# Patient Record
Sex: Female | Born: 1950 | Race: White | Hispanic: No | Marital: Married | Smoking: Never smoker
Health system: Southern US, Community
[De-identification: ages and names within clinical notes are randomized; demographics above are authoritative.]

## PROBLEM LIST (undated history)

## (undated) DIAGNOSIS — C801 Malignant (primary) neoplasm, unspecified: Secondary | ICD-10-CM

## (undated) DIAGNOSIS — F329 Major depressive disorder, single episode, unspecified: Secondary | ICD-10-CM

## (undated) DIAGNOSIS — F419 Anxiety disorder, unspecified: Secondary | ICD-10-CM

## (undated) DIAGNOSIS — M810 Age-related osteoporosis without current pathological fracture: Secondary | ICD-10-CM

## (undated) DIAGNOSIS — M2550 Pain in unspecified joint: Secondary | ICD-10-CM

## (undated) DIAGNOSIS — E079 Disorder of thyroid, unspecified: Secondary | ICD-10-CM

## (undated) DIAGNOSIS — E785 Hyperlipidemia, unspecified: Secondary | ICD-10-CM

## (undated) DIAGNOSIS — F32A Depression, unspecified: Secondary | ICD-10-CM

## (undated) DIAGNOSIS — T148XXA Other injury of unspecified body region, initial encounter: Secondary | ICD-10-CM

## (undated) DIAGNOSIS — H269 Unspecified cataract: Secondary | ICD-10-CM

## (undated) HISTORY — PX: FRACTURE SURGERY: SHX138

## (undated) HISTORY — PX: EYE SURGERY: SHX253

## (undated) HISTORY — DX: Disorder of thyroid, unspecified: E07.9

## (undated) HISTORY — DX: Anxiety disorder, unspecified: F41.9

## (undated) HISTORY — PX: TUBAL LIGATION: SHX77

## (undated) HISTORY — DX: Age-related osteoporosis without current pathological fracture: M81.0

## (undated) HISTORY — DX: Hyperlipidemia, unspecified: E78.5

## (undated) HISTORY — DX: Depression, unspecified: F32.A

## (undated) HISTORY — DX: Other injury of unspecified body region, initial encounter: T14.8XXA

## (undated) HISTORY — DX: Unspecified cataract: H26.9

## (undated) HISTORY — PX: SKIN CANCER EXCISION: SHX779

## (undated) HISTORY — DX: Major depressive disorder, single episode, unspecified: F32.9

## (undated) HISTORY — DX: Pain in unspecified joint: M25.50

---

## 2014-07-11 HISTORY — PX: COLONOSCOPY: SHX174

## 2014-12-15 ENCOUNTER — Encounter: Payer: Self-pay | Admitting: Family Medicine

## 2014-12-15 ENCOUNTER — Ambulatory Visit (INDEPENDENT_AMBULATORY_CARE_PROVIDER_SITE_OTHER): Payer: Medicare Other | Admitting: Family Medicine

## 2014-12-15 VITALS — BP 120/80 | HR 62 | Ht 62.0 in | Wt 165.0 lb

## 2014-12-15 DIAGNOSIS — Z860100 Personal history of colon polyps, unspecified: Secondary | ICD-10-CM

## 2014-12-15 DIAGNOSIS — F418 Other specified anxiety disorders: Secondary | ICD-10-CM | POA: Diagnosis not present

## 2014-12-15 DIAGNOSIS — M810 Age-related osteoporosis without current pathological fracture: Secondary | ICD-10-CM | POA: Diagnosis not present

## 2014-12-15 DIAGNOSIS — M25562 Pain in left knee: Secondary | ICD-10-CM | POA: Diagnosis not present

## 2014-12-15 DIAGNOSIS — Z820 Family history of epilepsy and other diseases of the nervous system: Secondary | ICD-10-CM | POA: Diagnosis not present

## 2014-12-15 DIAGNOSIS — Z79899 Other long term (current) drug therapy: Secondary | ICD-10-CM | POA: Diagnosis not present

## 2014-12-15 DIAGNOSIS — E559 Vitamin D deficiency, unspecified: Secondary | ICD-10-CM

## 2014-12-15 DIAGNOSIS — E66811 Obesity, class 1: Secondary | ICD-10-CM | POA: Insufficient documentation

## 2014-12-15 DIAGNOSIS — Z806 Family history of leukemia: Secondary | ICD-10-CM | POA: Diagnosis not present

## 2014-12-15 DIAGNOSIS — E785 Hyperlipidemia, unspecified: Secondary | ICD-10-CM | POA: Diagnosis not present

## 2014-12-15 DIAGNOSIS — F325 Major depressive disorder, single episode, in full remission: Secondary | ICD-10-CM | POA: Insufficient documentation

## 2014-12-15 DIAGNOSIS — E669 Obesity, unspecified: Secondary | ICD-10-CM | POA: Diagnosis not present

## 2014-12-15 DIAGNOSIS — E039 Hypothyroidism, unspecified: Secondary | ICD-10-CM | POA: Diagnosis not present

## 2014-12-15 DIAGNOSIS — Z23 Encounter for immunization: Secondary | ICD-10-CM

## 2014-12-15 DIAGNOSIS — Z801 Family history of malignant neoplasm of trachea, bronchus and lung: Secondary | ICD-10-CM

## 2014-12-15 MED ORDER — ZOSTER VACCINE LIVE 19400 UNT/0.65ML ~~LOC~~ SOLR: 0.6500 mL | Freq: Once | SUBCUTANEOUS | Status: DC

## 2014-12-15 MED ORDER — SERTRALINE HCL 50 MG PO TABS: 50.0000 mg | ORAL_TABLET | Freq: Every day | ORAL | Status: DC

## 2014-12-15 MED ORDER — NAPROXEN 500 MG PO TABS: 500.0000 mg | ORAL_TABLET | Freq: Two times a day (BID) | ORAL | Status: DC

## 2014-12-15 NOTE — Patient Instructions (Signed)
The following medications may be stopped:  Alendronate Atorvastatin B complex vitamin Potassium Ester-C Caltrate

## 2014-12-18 NOTE — Progress Notes (Signed)
Date:  12/15/2014   Name:  Debbie Ortega   DOB:  [birth year]   MRN:  [id number]  PCP:  Adline Potter, MD    Chief Complaint: Establish Care and Knee Pain   History of Present Illness:  This is a 64 y.o. female for initial visit, moved to [place] last month. C/o L knee pain since 08/28/2022, seen UC Sept, XR negative, given steroids and tramadol, saw PCP in Nov, had negative [place] and ortho referral/MRI advised, given another course steroids/tramadol. Currently taking Advil 600 mg bid only, still having pain, worse with walking and prolonged standing, swells on occasion, no hx trauma. Hx hypothyroidism, TSH 3.86 last month. Hx osteoporosis on Fosamax for several years. Hx depression/anxiety on Zoloft x 22yrs and prn Xanax, well controlled. Hx HLD on Lipitor past year. Hx vit D def on oral replacement. Three polyps on colonoscopy 2012, repeat in July negative, told needs repeat in 5 yrs. Due tetanus imm in 2017, had flu imm and Prevnar in Oct, never had zoster imm. Had neg mammo in April and saw optho in Aug 28, 2022. Husband died 4 yrs ago with COPD (smoker), mother died lung CA (smoker), father died in 8's with Parkinsonism and bowel obstruction, brother died leukemia.  Review of Systems:  Review of Systems  Constitutional: Negative for fever and chills.  HENT: Negative for ear pain, sore throat and trouble swallowing.   Eyes: Negative for pain.  Respiratory: Negative for shortness of breath.   Cardiovascular: Negative for chest pain.  Gastrointestinal: Negative for abdominal pain.  Endocrine: Negative for polyuria.  Genitourinary: Negative for difficulty urinating and pelvic pain.  Neurological: Negative for syncope and light-headedness.  Hematological: Negative for adenopathy.  Psychiatric/Behavioral: Negative for sleep disturbance.    Patient Active Problem List   Diagnosis Date Noted  . Left knee pain 12/15/2014  . Osteoporosis 12/15/2014  . Hypothyroidism 12/15/2014  . Depression with anxiety  12/15/2014  . Hyperlipidemia 12/15/2014  . Hx of colonic polyps 12/15/2014  . FH: lung cancer 12/15/2014  . FH: Parkinson's disease 12/15/2014  . FH: leukemia 12/15/2014  . Obesity, Class I, BMI 30-34.9 12/15/2014  . Vitamin D deficiency 12/15/2014    Prior to Admission medications   Medication Sig Start Date End Date Taking? Authorizing Provider  ALPRAZolam (XANAX) 0.25 MG tablet Take 0.25 mg by mouth every 6 (six) hours as needed.   Yes Historical Provider, MD  cholecalciferol (VITAMIN D) 400 UNITS TABS tablet Take 400 Units by mouth daily.   Yes Historical Provider, MD  levothyroxine (SYNTHROID, LEVOTHROID) 50 MCG tablet Take 50 mcg by mouth daily before breakfast. 1 day a week   Yes Historical Provider, MD  levothyroxine (SYNTHROID, LEVOTHROID) 75 MCG tablet Take 75 mcg by mouth daily before breakfast. 6 days a week    Yes Historical Provider, MD  Multiple Vitamin (MULTIVITAMIN WITH MINERALS) TABS tablet Take 1 tablet by mouth daily.   Yes Historical Provider, MD  naproxen (NAPROSYN) 500 MG tablet Take 1 tablet (500 mg total) by mouth 2 (two) times daily with a meal. 12/15/14   Adline Potter, MD  sertraline (ZOLOFT) 50 MG tablet Take 1 tablet (50 mg total) by mouth daily. 12/15/14  Yes Adline Potter, MD  zoster vaccine live, PF, (ZOSTAVAX) 19147 UNT/0.65ML injection Inject 19,400 Units into the skin once. 12/15/14   Adline Potter, MD    No Known Allergies  Past Surgical History  Procedure Laterality Date  . Tubal ligation    . Skin cancer excision  nose  . Colonoscopy  07/2014    normal cleared for 10 yrs    Social History  Substance Use Topics  . Smoking status: Never Smoker   . Smokeless tobacco: None  . Alcohol Use: 0.0 oz/week    0 Standard drinks or equivalent per week    History reviewed. No pertinent family history.  Medication list has been reviewed and updated.  Physical Examination: BP 120/80 mmHg  Pulse 62  Ht 5\' 2"  (1.575 m)  Wt 165 lb (74.844 kg)   BMI 30.17 kg/m2  Physical Exam  Constitutional: She is oriented to person, place, and time. She appears well-developed and well-nourished.  HENT:  Head: Normocephalic and atraumatic.  Right Ear: External ear normal.  Left Ear: External ear normal.  Nose: Nose normal.  Mouth/Throat: Oropharynx is clear and moist.  Eyes: Conjunctivae and EOM are normal. Pupils are equal, round, and reactive to light.  Neck: Neck supple. No thyromegaly present.  Cardiovascular: Normal rate, regular rhythm and normal heart sounds.   Pulmonary/Chest: Effort normal and breath sounds normal.  Abdominal: Soft. She exhibits no distension and no mass. There is no tenderness.  Musculoskeletal: She exhibits no edema.  L knee stable, no effusion, mild posterior tenderness  Lymphadenopathy:    She has no cervical adenopathy.  Neurological: She is alert and oriented to person, place, and time. Coordination normal.  Skin: Skin is warm and dry.  Psychiatric: She has a normal mood and affect. Her behavior is normal.  Nursing note and vitals reviewed.   Assessment and Plan:  1. Left knee pain Persistent x 4 months, change Advil to Naprosyn, refer ortho, likely needs MRI - naproxen (NAPROSYN) 500 MG tablet; Take 1 tablet (500 mg total) by mouth 2 (two) times daily with a meal.  Dispense: 30 tablet; Refill: 0 - Ambulatory referral to Orthopedic Surgery  2. Osteoporosis Discussed risk/benefits Fosamax, recommend d/c  3. Hypothyroidism, unspecified hypothyroidism type TSH normal last month. Consider change to 75 mcg daily after TSH next visit.  4. Depression with anxiety Well controlled, cont Zoloft, use Xanax sparingly (risks discussed)  5. Hyperlipidemia Lipid profile from last month reviewed, recommend d/c Lipitor, recheck lipids next visit  6. Obesity, Class I, BMI 30-34.9 Discussed exercise/weight loss  7. Vitamin D deficiency Continue supplementation, recheck level next visit  8. Polypharmacy D/c B  complex, potassium, ester-C, and Caltrate  9. Hx of colonic polyps Colonoscopy due 2021  10. FH: lung cancer  11. FH: Parkinson's disease  12. FH: leukemia  13. Need for zoster vaccination Rx for zoster imm sent, needs Pneumovax after April   Return in about 4 weeks (around 01/12/2015).  Satira Anis. [facility]  12/18/2014

## 2015-01-15 ENCOUNTER — Ambulatory Visit: Payer: Medicare Other | Admitting: Family Medicine

## 2015-03-26 ENCOUNTER — Telehealth: Payer: Self-pay

## 2015-03-26 NOTE — Telephone Encounter (Signed)
Sent to [facility] 

## 2015-03-27 MED ORDER — LEVOTHYROXINE SODIUM 75 MCG PO TABS: 75.0000 ug | ORAL_TABLET | Freq: Every day | ORAL | Status: DC

## 2015-03-27 NOTE — Addendum Note (Signed)
Addended by: Adline Potter on: 03/27/2015 04:28 PM   Modules accepted: Orders

## 2015-03-27 NOTE — Telephone Encounter (Signed)
Dose increased to 75 mcg daily (instead of 75 mcg 6 days/week and 50 mcg once a week). Generic should work fine. Recommend return to office in one month for re-eval and repeat TSH (was supposed to see me back in January).

## 2015-03-30 ENCOUNTER — Other Ambulatory Visit: Payer: Self-pay

## 2015-03-30 ENCOUNTER — Telehealth: Payer: Self-pay

## 2015-03-30 ENCOUNTER — Other Ambulatory Visit: Payer: Self-pay | Admitting: Family Medicine

## 2015-03-30 MED ORDER — LEVOTHYROXINE SODIUM 75 MCG PO TABS: 75.0000 ug | ORAL_TABLET | Freq: Every day | ORAL | Status: DC

## 2015-03-30 NOTE — Progress Notes (Signed)
Called pt with return to clinic in 1 month

## 2015-04-06 ENCOUNTER — Telehealth: Payer: Self-pay

## 2015-04-06 NOTE — Telephone Encounter (Signed)
Sent to [facility] 

## 2015-04-07 NOTE — Telephone Encounter (Signed)
Pt will try levothy. 82mcg- "If I start to feel funny, I want to go to someone else." I told her to sched an appt with you in a couple of weeks for follow up

## 2015-04-07 NOTE — Telephone Encounter (Signed)
Suspect insurance is not paying for brand name Synthroid as generic available. Generic is okay to use. Also, I have changed dose from 75 mcg six days a week and 50 mcg one day a week to 75 mcg every day.

## 2015-06-21 ENCOUNTER — Encounter: Payer: Self-pay | Admitting: Emergency Medicine

## 2015-06-21 ENCOUNTER — Emergency Department
Admission: EM | Admit: 2015-06-21 | Discharge: 2015-06-21 | Disposition: A | Discharge status: Home / Self Care | Payer: Medicare Other | Attending: Emergency Medicine | Admitting: Emergency Medicine

## 2015-06-21 ENCOUNTER — Emergency Department: Payer: Medicare Other

## 2015-06-21 DIAGNOSIS — Z791 Long term (current) use of non-steroidal anti-inflammatories (NSAID): Secondary | ICD-10-CM | POA: Diagnosis not present

## 2015-06-21 DIAGNOSIS — S82842A Displaced bimalleolar fracture of left lower leg, initial encounter for closed fracture: Secondary | ICD-10-CM

## 2015-06-21 DIAGNOSIS — M810 Age-related osteoporosis without current pathological fracture: Secondary | ICD-10-CM | POA: Diagnosis not present

## 2015-06-21 DIAGNOSIS — S8252XA Displaced fracture of medial malleolus of left tibia, initial encounter for closed fracture: Secondary | ICD-10-CM | POA: Diagnosis not present

## 2015-06-21 DIAGNOSIS — Y939 Activity, unspecified: Secondary | ICD-10-CM | POA: Insufficient documentation

## 2015-06-21 DIAGNOSIS — Y999 Unspecified external cause status: Secondary | ICD-10-CM | POA: Diagnosis not present

## 2015-06-21 DIAGNOSIS — Y929 Unspecified place or not applicable: Secondary | ICD-10-CM | POA: Diagnosis not present

## 2015-06-21 DIAGNOSIS — Z85828 Personal history of other malignant neoplasm of skin: Secondary | ICD-10-CM | POA: Diagnosis not present

## 2015-06-21 DIAGNOSIS — E785 Hyperlipidemia, unspecified: Secondary | ICD-10-CM | POA: Insufficient documentation

## 2015-06-21 DIAGNOSIS — F329 Major depressive disorder, single episode, unspecified: Secondary | ICD-10-CM | POA: Insufficient documentation

## 2015-06-21 DIAGNOSIS — E039 Hypothyroidism, unspecified: Secondary | ICD-10-CM | POA: Insufficient documentation

## 2015-06-21 DIAGNOSIS — Z79899 Other long term (current) drug therapy: Secondary | ICD-10-CM | POA: Diagnosis not present

## 2015-06-21 DIAGNOSIS — W010XXA Fall on same level from slipping, tripping and stumbling without subsequent striking against object, initial encounter: Secondary | ICD-10-CM | POA: Diagnosis not present

## 2015-06-21 DIAGNOSIS — S99912A Unspecified injury of left ankle, initial encounter: Secondary | ICD-10-CM | POA: Diagnosis present

## 2015-06-21 DIAGNOSIS — T148XXA Other injury of unspecified body region, initial encounter: Secondary | ICD-10-CM

## 2015-06-21 HISTORY — DX: Other injury of unspecified body region, initial encounter: T14.8XXA

## 2015-06-21 MED ORDER — BACITRACIN ZINC 500 UNIT/GM EX OINT
TOPICAL_OINTMENT | Freq: Once | CUTANEOUS | Status: AC
  Administered 2015-06-21: 1 via TOPICAL
  Filled 2015-06-21: qty 0.9

## 2015-06-21 MED ORDER — OXYCODONE-ACETAMINOPHEN 5-325 MG PO TABS: 1.0000 | ORAL_TABLET | ORAL | Status: DC | PRN

## 2015-06-21 MED ORDER — HYDROCODONE-ACETAMINOPHEN 5-325 MG PO TABS
2.0000 | ORAL_TABLET | Freq: Once | ORAL | Status: AC
  Administered 2015-06-21: 2 via ORAL
  Filled 2015-06-21: qty 2

## 2015-06-21 NOTE — ED Notes (Signed)
Pt comes into the ED via POV c/o left ankle pain after tripping over the dog and falling down some stairs.  Patient denies hitting her head or losing consciousness.  Ankle is noted to have some swelling and minimal movement in the left ankle.

## 2015-06-21 NOTE — Discharge Instructions (Signed)
Ankle Fracture A fracture is a break in a bone. The ankle joint is made up of three bones. These include the lower (distal)sections of your lower leg bones, called the tibia and fibula, along with a bone in your foot, called the talus. Depending on how bad the break is and if more than one ankle joint bone is broken, a cast or splint is used to protect and keep your injured bone from moving while it heals. Sometimes, surgery is required to help the fracture heal properly.  There are two general types of fractures:  Stable fracture. This includes a single fracture line through one bone, with no injury to ankle ligaments. A fracture of the talus that does not have any displacement (movement of the bone on either side of the fracture line) is also stable.  Unstable fracture. This includes more than one fracture line through one or more bones in the ankle joint. It also includes fractures that have displacement of the bone on either side of the fracture line. CAUSES  A direct blow to the ankle.   Quickly and severely twisting your ankle.  Trauma, such as a car accident or falling from a significant height. RISK FACTORS You may be at a higher risk of ankle fracture if:  You have certain medical conditions.  You are involved in high-impact sports.  You are involved in a high-impact car accident. SIGNS AND SYMPTOMS   Tender and swollen ankle.  Bruising around the injured ankle.  Pain on movement of the ankle.  Difficulty walking or putting weight on the ankle.  A cold foot below the site of the ankle injury. This can occur if the blood vessels passing through your injured ankle were also damaged.  Numbness in the foot below the site of the ankle injury. DIAGNOSIS  An ankle fracture is usually diagnosed with a physical exam and X-rays. A CT scan may also be required for complex fractures. TREATMENT  Stable fractures are treated with a cast or splint and using crutches to avoid putting  weight on your injured ankle. This is followed by an ankle strengthening program. Some patients require a special type of cast, depending on other medical problems they may have. Unstable fractures require surgery to ensure the bones heal properly. Your health care provider will tell you what type of fracture you have and the best treatment for your condition. HOME CARE INSTRUCTIONS   Review correct crutch use with your health care provider and use your crutches as directed. Safe use of crutches is extremely important. Misuse of crutches can cause you to fall or cause injury to nerves in your hands or armpits.  Do not put weight or pressure on the injured ankle until directed by your health care provider.  To lessen the swelling, keep the injured leg elevated while sitting or lying down.  Apply ice to the injured area:  Put ice in a plastic bag.  Place a towel between your cast and the bag.  Leave the ice on for 20 minutes, 2-3 times a day.  If you have a plaster or fiberglass cast:  Do not try to scratch the skin under the cast with any objects. This can increase your risk of skin infection.  Check the skin around the cast every day. You may put lotion on any red or sore areas.  Keep your cast dry and clean.  If you have a plaster splint:  Wear the splint as directed.  You may loosen the elastic  around the splint if your toes become numb, tingle, or turn cold or blue. °· Do not put pressure on any part of your cast or splint; it may break. Rest your cast only on a pillow the first 24 hours until it is fully hardened. °· Your cast or splint can be protected during bathing with a plastic bag sealed to your skin with medical tape. Do not lower the cast or splint into water. °· Take medicines as directed by your health care provider. Only take over-the-counter or prescription medicines for pain, discomfort, or fever as directed by your health care provider. °· Do not drive a vehicle until  your health care provider specifically tells you it is safe to do so. °· If your health care provider has given you a follow-up appointment, it is very important to keep that appointment. Not keeping the appointment could result in a chronic or permanent injury, pain, and disability. If you have any problem keeping the appointment, call the facility for assistance. °SEEK MEDICAL CARE IF: °You develop increased swelling or discomfort. °SEEK IMMEDIATE MEDICAL CARE IF:  °· Your cast gets damaged or breaks. °· You have continued severe pain. °· You develop new pain or swelling after the cast was put on. °· Your skin or toenails below the injury turn blue or gray. °· Your skin or toenails below the injury feel cold, numb, or have loss of sensitivity to touch. °· There is a bad smell or pus draining from under the cast. °MAKE SURE YOU:  °· Understand these instructions. °· Will watch your condition. °· Will get help right away if you are not doing well or get worse. °  °This information is not intended to replace advice given to you by your health care provider. Make sure you discuss any questions you have with your health care provider. °  °Document Released: 12/25/1999 Document Revised: 01/01/2013 Document Reviewed: 07/26/2012 °Elsevier Interactive Patient Education ©2016 [employer] ° °Cast or Splint Care °Casts and splints support injured limbs and keep bones from moving while they heal. It is important to care for your cast or splint at home.   °HOME CARE INSTRUCTIONS °· Keep the cast or splint uncovered during the drying period. It can take 24 to 48 hours to dry if it is made of plaster. A fiberglass cast will dry in less than 1 hour. °· Do not rest the cast on anything harder than a pillow for the first 24 hours. °· Do not put weight on your injured limb or apply pressure to the cast until your health care provider gives you permission. °· Keep the cast or splint dry. Wet casts or splints can lose their shape and  may not support the limb as well. A wet cast that has lost its shape can also create harmful pressure on your skin when it dries. Also, wet skin can become infected. °¨ Cover the cast or splint with a plastic bag when bathing or when out in the rain or snow. If the cast is on the trunk of the body, take sponge baths until the cast is removed. °¨ If your cast does become wet, dry it with a towel or a blow dryer on the cool setting only. °· Keep your cast or splint clean. Soiled casts may be wiped with a moistened cloth. °· Do not place any hard or soft foreign objects under your cast or splint, such as cotton, toilet paper, lotion, or powder. °· Do not try to scratch the   skin under the cast with any object. The object could get stuck inside the cast. Also, scratching could lead to an infection. If itching is a problem, use a blow dryer on a cool setting to relieve discomfort. °· Do not trim or cut your cast or remove padding from inside of it. °· Exercise all joints next to the injury that are not immobilized by the cast or splint. For example, if you have a long leg cast, exercise the hip joint and toes. If you have an arm cast or splint, exercise the shoulder, elbow, thumb, and fingers. °· Elevate your injured arm or leg on 1 or 2 pillows for the first 1 to 3 days to decrease swelling and pain. It is best if you can comfortably elevate your cast so it is higher than your heart. °SEEK MEDICAL CARE IF:  °· Your cast or splint cracks. °· Your cast or splint is too tight or too loose. °· You have unbearable itching inside the cast. °· Your cast becomes wet or develops a soft spot or area. °· You have a bad smell coming from inside your cast. °· You get an object stuck under your cast. °· Your skin around the cast becomes red or raw. °· You have new pain or worsening pain after the cast has been applied. °SEEK IMMEDIATE MEDICAL CARE IF:  °· You have fluid leaking through the cast. °· You are unable to move your fingers  or toes. °· You have discolored (blue or white), cool, painful, or very swollen fingers or toes beyond the cast. °· You have tingling or numbness around the injured area. °· You have severe pain or pressure under the cast. °· You have any difficulty with your breathing or have shortness of breath. °· You have chest pain. °  °This information is not intended to replace advice given to you by your health care provider. Make sure you discuss any questions you have with your health care provider. °  °Document Released: 12/25/1999 Document Revised: 10/17/2012 Document Reviewed: 07/05/2012 °Elsevier Interactive Patient Education ©2016 [employer] ° °
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
## 2015-06-23 DIAGNOSIS — S82842A Displaced bimalleolar fracture of left lower leg, initial encounter for closed fracture: Secondary | ICD-10-CM | POA: Diagnosis not present

## 2015-06-24 ENCOUNTER — Ambulatory Visit: Payer: Medicare Other | Admitting: Family Medicine

## 2015-06-25 ENCOUNTER — Ambulatory Visit
Admission: RE | Admit: 2015-06-25 | Discharge: 2015-06-26 | Disposition: A | Discharge status: Home / Self Care | Payer: Medicare Other | Source: Ambulatory Visit | Attending: Orthopedic Surgery | Admitting: Orthopedic Surgery

## 2015-06-25 ENCOUNTER — Ambulatory Visit: Payer: Medicare Other | Admitting: Anesthesiology

## 2015-06-25 ENCOUNTER — Ambulatory Visit: Payer: Medicare Other

## 2015-06-25 ENCOUNTER — Encounter
Admission: RE | Disposition: A | Discharge status: Home / Self Care | Payer: Self-pay | Source: Ambulatory Visit | Attending: Orthopedic Surgery

## 2015-06-25 DIAGNOSIS — Z8781 Personal history of (healed) traumatic fracture: Secondary | ICD-10-CM

## 2015-06-25 DIAGNOSIS — M810 Age-related osteoporosis without current pathological fracture: Secondary | ICD-10-CM | POA: Insufficient documentation

## 2015-06-25 DIAGNOSIS — E785 Hyperlipidemia, unspecified: Secondary | ICD-10-CM | POA: Diagnosis not present

## 2015-06-25 DIAGNOSIS — Z79899 Other long term (current) drug therapy: Secondary | ICD-10-CM | POA: Diagnosis not present

## 2015-06-25 DIAGNOSIS — Z86010 Personal history of colonic polyps: Secondary | ICD-10-CM | POA: Diagnosis not present

## 2015-06-25 DIAGNOSIS — Z9889 Other specified postprocedural states: Secondary | ICD-10-CM

## 2015-06-25 DIAGNOSIS — E039 Hypothyroidism, unspecified: Secondary | ICD-10-CM | POA: Diagnosis not present

## 2015-06-25 DIAGNOSIS — S82842A Displaced bimalleolar fracture of left lower leg, initial encounter for closed fracture: Secondary | ICD-10-CM | POA: Insufficient documentation

## 2015-06-25 DIAGNOSIS — F418 Other specified anxiety disorders: Secondary | ICD-10-CM | POA: Diagnosis not present

## 2015-06-25 DIAGNOSIS — Z9851 Tubal ligation status: Secondary | ICD-10-CM | POA: Insufficient documentation

## 2015-06-25 DIAGNOSIS — S8262XA Displaced fracture of lateral malleolus of left fibula, initial encounter for closed fracture: Secondary | ICD-10-CM | POA: Diagnosis not present

## 2015-06-25 DIAGNOSIS — W109XXA Fall (on) (from) unspecified stairs and steps, initial encounter: Secondary | ICD-10-CM | POA: Insufficient documentation

## 2015-06-25 HISTORY — PX: ORIF ANKLE FRACTURE: SHX5408

## 2015-06-25 SURGERY — OPEN REDUCTION INTERNAL FIXATION (ORIF) ANKLE FRACTURE
Anesthesia: General | Site: Ankle | Laterality: Left | Wound class: Clean

## 2015-06-25 MED ORDER — ONDANSETRON HCL 4 MG/2ML IJ SOLN: 4.0000 mg | Freq: Four times a day (QID) | INTRAMUSCULAR | Status: DC | PRN

## 2015-06-25 MED ORDER — LACTATED RINGERS IV SOLN
INTRAVENOUS | Status: DC | PRN
  Administered 2015-06-25: 12:00:00 via INTRAVENOUS

## 2015-06-25 MED ORDER — PROMETHAZINE HCL 25 MG/ML IJ SOLN
25.0000 mg | Freq: Once | INTRAMUSCULAR | Status: AC
  Administered 2015-06-25: 25 mg via INTRAMUSCULAR

## 2015-06-25 MED ORDER — OXYCODONE HCL 5 MG PO TABS
10.0000 mg | ORAL_TABLET | Freq: Once | ORAL | Status: AC
  Administered 2015-06-25: 10 mg via ORAL
  Filled 2015-06-25: qty 2

## 2015-06-25 MED ORDER — PROPOFOL 10 MG/ML IV BOLUS
INTRAVENOUS | Status: DC | PRN
  Administered 2015-06-25: 160 mg via INTRAVENOUS

## 2015-06-25 MED ORDER — ADULT MULTIVITAMIN W/MINERALS CH
1.0000 | ORAL_TABLET | Freq: Every day | ORAL | Status: DC
  Administered 2015-06-26: 1 via ORAL
  Filled 2015-06-25: qty 1

## 2015-06-25 MED ORDER — OXYCODONE HCL 5 MG PO TABS
5.0000 mg | ORAL_TABLET | ORAL | Status: DC | PRN
  Administered 2015-06-25: 5 mg via ORAL
  Administered 2015-06-25 – 2015-06-26 (×4): 10 mg via ORAL
  Filled 2015-06-25 (×4): qty 2

## 2015-06-25 MED ORDER — FENTANYL CITRATE (PF) 100 MCG/2ML IJ SOLN
25.0000 ug | INTRAMUSCULAR | Status: AC | PRN
  Administered 2015-06-25 (×6): 25 ug via INTRAVENOUS

## 2015-06-25 MED ORDER — OXYCODONE HCL 5 MG PO TABS
ORAL_TABLET | ORAL | Status: AC
  Filled 2015-06-25: qty 1

## 2015-06-25 MED ORDER — HYDROMORPHONE HCL 1 MG/ML IJ SOLN
0.2500 mg | INTRAMUSCULAR | Status: DC | PRN
  Administered 2015-06-25 (×4): 0.5 mg via INTRAVENOUS

## 2015-06-25 MED ORDER — CHOLECALCIFEROL 10 MCG (400 UNIT) PO TABS
400.0000 [IU] | ORAL_TABLET | Freq: Every day | ORAL | Status: DC
  Administered 2015-06-26: 400 [IU] via ORAL
  Filled 2015-06-25 (×2): qty 1

## 2015-06-25 MED ORDER — FENTANYL CITRATE (PF) 100 MCG/2ML IJ SOLN
INTRAMUSCULAR | Status: AC
  Filled 2015-06-25: qty 2

## 2015-06-25 MED ORDER — ALPRAZOLAM 0.25 MG PO TABS: 0.2500 mg | ORAL_TABLET | Freq: Four times a day (QID) | ORAL | Status: DC | PRN

## 2015-06-25 MED ORDER — EPHEDRINE SULFATE 50 MG/ML IJ SOLN
INTRAMUSCULAR | Status: DC | PRN
  Administered 2015-06-25: 10 mg via INTRAVENOUS

## 2015-06-25 MED ORDER — BISACODYL 5 MG PO TBEC: 5.0000 mg | DELAYED_RELEASE_TABLET | Freq: Every day | ORAL | Status: DC | PRN

## 2015-06-25 MED ORDER — ONDANSETRON HCL 4 MG/2ML IJ SOLN
4.0000 mg | Freq: Once | INTRAMUSCULAR | Status: AC | PRN
  Administered 2015-06-25: 4 mg via INTRAVENOUS

## 2015-06-25 MED ORDER — SODIUM CHLORIDE 0.9 % IV SOLN
INTRAVENOUS | Status: DC
  Administered 2015-06-25 – 2015-06-26 (×2): via INTRAVENOUS

## 2015-06-25 MED ORDER — NEOMYCIN-POLYMYXIN B GU 40-200000 IR SOLN
Status: AC
Start: 2015-06-25 — End: 2015-06-25
  Filled 2015-06-25: qty 4

## 2015-06-25 MED ORDER — ONDANSETRON HCL 4 MG/2ML IJ SOLN
INTRAMUSCULAR | Status: AC
  Filled 2015-06-25: qty 2

## 2015-06-25 MED ORDER — CEFAZOLIN SODIUM-DEXTROSE 2-4 GM/100ML-% IV SOLN
INTRAVENOUS | Status: AC
  Filled 2015-06-25: qty 100

## 2015-06-25 MED ORDER — LEVOTHYROXINE SODIUM 75 MCG PO TABS
75.0000 ug | ORAL_TABLET | Freq: Every day | ORAL | Status: DC
  Administered 2015-06-26: 75 ug via ORAL
  Filled 2015-06-25: qty 1

## 2015-06-25 MED ORDER — METOCLOPRAMIDE HCL 10 MG PO TABS
5.0000 mg | ORAL_TABLET | Freq: Three times a day (TID) | ORAL | Status: DC | PRN
  Administered 2015-06-25: 10 mg via ORAL

## 2015-06-25 MED ORDER — ACETAMINOPHEN 650 MG RE SUPP: 650.0000 mg | Freq: Four times a day (QID) | RECTAL | Status: DC | PRN

## 2015-06-25 MED ORDER — MAGNESIUM HYDROXIDE 400 MG/5ML PO SUSP: 30.0000 mL | Freq: Every day | ORAL | Status: DC | PRN

## 2015-06-25 MED ORDER — SERTRALINE HCL 50 MG PO TABS
50.0000 mg | ORAL_TABLET | Freq: Every day | ORAL | Status: DC
  Administered 2015-06-25 – 2015-06-26 (×2): 50 mg via ORAL
  Filled 2015-06-25 (×2): qty 1

## 2015-06-25 MED ORDER — ACETAMINOPHEN 325 MG PO TABS: 650.0000 mg | ORAL_TABLET | Freq: Four times a day (QID) | ORAL | Status: DC | PRN

## 2015-06-25 MED ORDER — ONDANSETRON HCL 4 MG PO TABS
4.0000 mg | ORAL_TABLET | Freq: Four times a day (QID) | ORAL | Status: DC | PRN
  Administered 2015-06-25: 4 mg via ORAL

## 2015-06-25 MED ORDER — FAMOTIDINE 20 MG PO TABS
20.0000 mg | ORAL_TABLET | Freq: Once | ORAL | Status: AC
  Administered 2015-06-25: 20 mg via ORAL

## 2015-06-25 MED ORDER — ONDANSETRON HCL 4 MG/2ML IJ SOLN
INTRAMUSCULAR | Status: DC | PRN
  Administered 2015-06-25: 4 mg via INTRAVENOUS

## 2015-06-25 MED ORDER — CEFAZOLIN SODIUM 1-5 GM-% IV SOLN
1.0000 g | Freq: Four times a day (QID) | INTRAVENOUS | Status: AC
  Administered 2015-06-25 – 2015-06-26 (×3): 1 g via INTRAVENOUS
  Filled 2015-06-25 (×3): qty 50

## 2015-06-25 MED ORDER — HYDROMORPHONE HCL 1 MG/ML IJ SOLN
INTRAMUSCULAR | Status: AC
  Filled 2015-06-25: qty 2

## 2015-06-25 MED ORDER — OXYCODONE HCL 5 MG PO TABS: 5.0000 mg | ORAL_TABLET | ORAL | Status: DC | PRN

## 2015-06-25 MED ORDER — SODIUM CHLORIDE 0.9 % IV SOLN: INTRAVENOUS | Status: DC

## 2015-06-25 MED ORDER — DOCUSATE SODIUM 100 MG PO CAPS
100.0000 mg | ORAL_CAPSULE | Freq: Two times a day (BID) | ORAL | Status: DC
  Administered 2015-06-25 – 2015-06-26 (×2): 100 mg via ORAL
  Filled 2015-06-25 (×2): qty 1

## 2015-06-25 MED ORDER — PROMETHAZINE HCL 25 MG/ML IJ SOLN
INTRAMUSCULAR | Status: AC
  Filled 2015-06-25: qty 1

## 2015-06-25 MED ORDER — MORPHINE SULFATE (PF) 2 MG/ML IV SOLN
2.0000 mg | INTRAVENOUS | Status: DC | PRN
  Administered 2015-06-25 – 2015-06-26 (×2): 2 mg via INTRAVENOUS
  Filled 2015-06-25 (×2): qty 1

## 2015-06-25 MED ORDER — LIDOCAINE HCL (PF) 1 % IJ SOLN
INTRAMUSCULAR | Status: AC
  Filled 2015-06-25: qty 2

## 2015-06-25 MED ORDER — METOCLOPRAMIDE HCL 5 MG/ML IJ SOLN
INTRAMUSCULAR | Status: AC
  Filled 2015-06-25: qty 2

## 2015-06-25 MED ORDER — METOCLOPRAMIDE HCL 5 MG/ML IJ SOLN: 5.0000 mg | Freq: Three times a day (TID) | INTRAMUSCULAR | Status: DC | PRN

## 2015-06-25 MED ORDER — LACTATED RINGERS IV SOLN: INTRAVENOUS | Status: DC

## 2015-06-25 MED ORDER — FENTANYL CITRATE (PF) 100 MCG/2ML IJ SOLN
INTRAMUSCULAR | Status: DC | PRN
  Administered 2015-06-25 (×4): 25 ug via INTRAVENOUS

## 2015-06-25 MED ORDER — MIDAZOLAM HCL 2 MG/2ML IJ SOLN
INTRAMUSCULAR | Status: DC | PRN
  Administered 2015-06-25: 2 mg via INTRAVENOUS

## 2015-06-25 MED ORDER — FAMOTIDINE 20 MG PO TABS
ORAL_TABLET | ORAL | Status: AC
  Administered 2015-06-25: 20 mg via ORAL
  Filled 2015-06-25: qty 1

## 2015-06-25 MED ORDER — CEFAZOLIN SODIUM-DEXTROSE 2-4 GM/100ML-% IV SOLN
2.0000 g | Freq: Once | INTRAVENOUS | Status: AC
  Administered 2015-06-25: 2 g via INTRAVENOUS

## 2015-06-25 SURGICAL SUPPLY — 58 items
BANDAGE ACE 4X5 VEL STRL LF (GAUZE/BANDAGES/DRESSINGS) ×4 IMPLANT
BIT DRILL 2.5X2.75 QC CALB (BIT) ×2 IMPLANT
BIT DRILL CALIBRATED 2.7 (BIT) ×2 IMPLANT
BLADE SURG SZ10 CARB STEEL (BLADE) ×4 IMPLANT
BNDG ESMARK 4X12 TAN STRL LF (GAUZE/BANDAGES/DRESSINGS) ×2 IMPLANT
CANISTER SUCT 1200ML W/VALVE (MISCELLANEOUS) ×2 IMPLANT
CHLORAPREP W/TINT 26ML (MISCELLANEOUS) ×2 IMPLANT
CUFF TOURN 24 STER (MISCELLANEOUS) IMPLANT
CUFF TOURN 30 STER DUAL PORT (MISCELLANEOUS) ×2 IMPLANT
DRAPE FLUOR MINI C-ARM 54X84 (DRAPES) ×2 IMPLANT
DRAPE INCISE IOBAN 66X45 STRL (DRAPES) ×2 IMPLANT
DRAPE U-SHAPE 47X51 STRL (DRAPES) ×2 IMPLANT
DRSG EMULSION OIL 3X8 NADH (GAUZE/BANDAGES/DRESSINGS) ×2 IMPLANT
ELECT CAUTERY BLADE 6.4 (BLADE) ×2 IMPLANT
ELECT REM PT RETURN 9FT ADLT (ELECTROSURGICAL) ×2
ELECTRODE REM PT RTRN 9FT ADLT (ELECTROSURGICAL) ×1 IMPLANT
GAUZE PETRO XEROFOAM 1X8 (MISCELLANEOUS) ×2 IMPLANT
GAUZE SPONGE 4X4 12PLY STRL (GAUZE/BANDAGES/DRESSINGS) ×2 IMPLANT
GLOVE BIOGEL PI IND STRL 9 (GLOVE) ×1 IMPLANT
GLOVE BIOGEL PI INDICATOR 9 (GLOVE) ×1
GLOVE INDICATOR 7.5 STRL GRN (GLOVE) ×2 IMPLANT
GLOVE SURG ORTHO 9.0 STRL STRW (GLOVE) ×2 IMPLANT
GOWN STRL REUS W/ TWL LRG LVL3 (GOWN DISPOSABLE) ×1 IMPLANT
GOWN STRL REUS W/TWL LRG LVL3 (GOWN DISPOSABLE) ×1
GOWN SURG XXL (GOWNS) ×2 IMPLANT
HEMOVAC 400ML (MISCELLANEOUS) ×2
K-WIRE ACE 1.6X6 (WIRE) ×2
KIT DRAIN HEMOVAC JP 7FR 400ML (MISCELLANEOUS) ×1 IMPLANT
KIT RM TURNOVER STRD PROC AR (KITS) ×2 IMPLANT
KWIRE ACE 1.6X6 (WIRE) ×1 IMPLANT
LABEL OR SOLS (LABEL) ×2 IMPLANT
NS IRRIG 1000ML POUR BTL (IV SOLUTION) ×2 IMPLANT
PACK EXTREMITY ARMC (MISCELLANEOUS) ×2 IMPLANT
PAD ABD DERMACEA PRESS 5X9 (GAUZE/BANDAGES/DRESSINGS) ×4 IMPLANT
PAD CAST CTTN 4X4 STRL (SOFTGOODS) ×2 IMPLANT
PAD PREP 24X41 OB/GYN DISP (PERSONAL CARE ITEMS) ×2 IMPLANT
PADDING CAST COTTON 4X4 STRL (SOFTGOODS) ×2
PLATE LOCK 6H 77 BILAT FIB (Plate) ×2 IMPLANT
SCREW ACE CAN 4.0 36M (Screw) ×2 IMPLANT
SCREW LOCK CORT STAR 3.5X10 (Screw) ×2 IMPLANT
SCREW LOCK CORT STAR 3.5X12 (Screw) ×2 IMPLANT
SCREW LOCK CORT STAR 3.5X14 (Screw) ×2 IMPLANT
SCREW LOW PROFILE 12MMX3.5MM (Screw) ×2 IMPLANT
SCREW LOW PROFILE 18MMX3.5MM (Screw) ×2 IMPLANT
SCREW NON LOCKING LP 3.5 14MM (Screw) ×2 IMPLANT
SPLINT CAST 1 STEP 3X12 (MISCELLANEOUS) ×4 IMPLANT
SPONGE LAP 18X18 5 PK (GAUZE/BANDAGES/DRESSINGS) ×2 IMPLANT
STAPLER SKIN PROX 35W (STAPLE) ×2 IMPLANT
STOCKINETTE STRL 6IN 960660 (GAUZE/BANDAGES/DRESSINGS) ×2 IMPLANT
SUT ETHILON 3-0 FS-10 30 BLK (SUTURE) ×2
SUT MNCRL AB 4-0 PS2 18 (SUTURE) IMPLANT
SUT VIC AB 0 CT1 36 (SUTURE) IMPLANT
SUT VIC AB 2-0 SH 27 (SUTURE) ×1
SUT VIC AB 2-0 SH 27XBRD (SUTURE) ×1 IMPLANT
SUT VIC AB 3-0 SH 27 (SUTURE) ×1
SUT VIC AB 3-0 SH 27X BRD (SUTURE) ×1 IMPLANT
SUTURE EHLN 3-0 FS-10 30 BLK (SUTURE) ×1 IMPLANT
SYRINGE 10CC LL (SYRINGE) ×2 IMPLANT

## 2015-06-25 NOTE — Anesthesia Preprocedure Evaluation (Addendum)
Anesthesia Evaluation  Patient identified by MRN, date of birth, ID band Patient awake    Reviewed: Allergy & Precautions, NPO status , Patient's Chart, lab work & pertinent test results  History of Anesthesia Complications Negative for: history of anesthetic complications  Airway Mallampati: II       Dental   Pulmonary neg pulmonary ROS,           Cardiovascular negative cardio ROS       Neuro/Psych Anxiety Depression negative neurological ROS     GI/Hepatic negative GI ROS, Neg liver ROS,   Endo/Other  Hypothyroidism   Renal/GU negative Renal ROS     Musculoskeletal   Abdominal   Peds  Hematology negative hematology ROS (+)   Anesthesia Other Findings   Reproductive/Obstetrics                             Anesthesia Physical Anesthesia Plan  ASA: II  Anesthesia Plan: General   Post-op Pain Management:    Induction: Intravenous  Airway Management Planned: LMA  Additional Equipment:   Intra-op Plan:   Post-operative Plan:   Informed Consent: I have reviewed the patients History and Physical, chart, labs and discussed the procedure including the risks, benefits and alternatives for the proposed anesthesia with the patient or authorized representative who has indicated his/her understanding and acceptance.     Plan Discussed with:   Anesthesia Plan Comments:         Anesthesia Quick Evaluation

## 2015-06-25 NOTE — OR Nursing (Signed)
Pt wants to be admitted overnight. Dr Rudene Christians entered observation orders.

## 2015-06-25 NOTE — Discharge Instructions (Addendum)
Keep ankle elevated as much as possible through the weekend. Work on toe motion. Ice to front of ankle today and tomorrow. Aspirin 325 mg daily   AMBULATORY SURGERY  DISCHARGE INSTRUCTIONS   1) The drugs that you were given will stay in your system until tomorrow so for the next 24 hours you should not:  A) Drive an automobile B) Make any legal decisions C) Drink any alcoholic beverage   2) You may resume regular meals tomorrow.  Today it is better to start with liquids and gradually work up to solid foods.  You may eat anything you prefer, but it is better to start with liquids, then soup and crackers, and gradually work up to solid foods.   3) Please notify your doctor immediately if you have any unusual bleeding, trouble breathing, redness and pain at the surgery site, drainage, fever, or pain not relieved by medication.    4) Additional Instructions:        Please contact your physician with any problems or Same Day Surgery at [phone], Monday through Friday 6 am to 4 pm, or  at [facility] number at [phone].      Diet: As you were doing prior to hospitalization   Shower:  May shower but keep the wounds dry, use an occlusive plastic wrap, NO SOAKING IN TUB.  If the bandage gets wet, change with a clean dry gauze.  Dressing:  Keep splint clean and dry.   Activity:  Increase activity slowly as tolerated, but follow the weight bearing instructions below.  No lifting or driving for 6 weeks.  Weight Bearing:   Nonweightbearing left lower extremity.  To prevent constipation: you may use a stool softener such as -  Colace (over the counter) 100 mg by mouth twice a day  Drink plenty of fluids (prune juice may be helpful) and high fiber foods Miralax (over the counter) for constipation as needed.    Itching:  If you experience itching with your medications, try taking only a single pain pill, or even half a pain pill at a time.  You may take up to  10 pain pills per day, and you can also use benadryl over the counter for itching or also to help with sleep.   Precautions:  If you experience chest pain or shortness of breath - call 911 immediately for transfer to the hospital emergency department!!  If you develop a fever greater that 101 F, purulent drainage from wound, increased redness or drainage from wound, or calf pain-Call [place]                                               Follow- Up Appointment:  Please call for an appointment to be seen in 06/29/2015 at [facility]

## 2015-06-25 NOTE — OR Nursing (Signed)
Floor called for 15 minute call prior to taking pt to floor.

## 2015-06-25 NOTE — Op Note (Signed)
06/25/2015  1:11 PM  PATIENT:  Debbie Ortega  65 y.o. female  PRE-OPERATIVE DIAGNOSIS:  BIMALLEOLAR ANKLE FRACTURE CLOSED  POST-OPERATIVE DIAGNOSIS:  BIMALLEOLAR ANKLE FRACTURE CLOSED  PROCEDURE:  Procedure(s): OPEN REDUCTION INTERNAL FIXATION (ORIF) ANKLE FRACTURE (Left)  SURGEON: Laurene Footman, MD  ASSISTANTS: None  ANESTHESIA:   general  EBL:     BLOOD ADMINISTERED:none  DRAINS: none   LOCAL MEDICATIONS USED:  NONE  SPECIMEN:  No Specimen  DISPOSITION OF SPECIMEN:  N/A  COUNTS:  YES  TOURNIQUET:   48 minutes at 300 mmHg  IMPLANTS: Composite locking fibular plate with screws one 4.0 cannulated screw medial  DICTATION: .Dragon Dictation patient brought the operating room and after adequate anesthesia was obtained the left leg and draped in sterile fashion was turned by the upper thigh bump was placed on each buttock to internally rotate the leg. After patient identification and timeout procedures were completed tourniquet was raised to 3 mmHg. A lateral approach was made with a preservation of cutaneous nerves as much as possible. The fracture was held in a reduced position with reduction clamp and a 6 hole composite locking plate was contoured to fit the distal fibula and applied posterior laterally. Nonlocking screws placed proximally and distally get the plate down to the bone and then the remaining screw holes were filled drilling measuring and placing the screws proximally nonlocking distally locking. Anatomic reduction of the fibula fracture was obtained with this maneuver. Going medially and anterior medial approach was made and the fracture site exposed and the joint irrigated with no loose articular fragments noted. With the ankle dorsiflexed the medial malleolar fragment reduced well and a single 4.0 cannulated screws placed from the tip of the malleolus into the distal tibia 36 mm in length getting good compression at the fracture site. Under fluoroscopic views the  ankle appeared well aligned. The wound was were irrigated and then closed with 2-0 Vicryl subcutaneously and skin staples. Xeroform 4 x 4 web roll and Ace wrap applied with 2 3 and splints applied as a stirrup under the Ace wrap  PLAN OF CARE: Discharge to home after PACU  PATIENT DISPOSITION:  PACU - hemodynamically stable.

## 2015-06-25 NOTE — Anesthesia Procedure Notes (Signed)
Procedure Name: LMA Insertion Date/Time: 06/25/2015 11:57 AM Performed by: [id number], Briel Gallicchio Pre-anesthesia Checklist: Timeout performed, Patient being monitored, Suction available, Emergency Drugs available and Patient identified Patient Re-evaluated:Patient Re-evaluated prior to inductionOxygen Delivery Method: Circle system utilized Preoxygenation: Pre-oxygenation with 100% oxygen Intubation Type: IV induction LMA: LMA inserted LMA Size: 4.0 Tube type: Oral Number of attempts: 1 Placement Confirmation: breath sounds checked- equal and bilateral,  positive ETCO2 and CO2 detector Secured at: 0 cm Tube secured with: Tape

## 2015-06-25 NOTE — Anesthesia Postprocedure Evaluation (Signed)
Anesthesia Post Note  Patient: Debbie Ortega  Procedure(s) Performed: Procedure(s) (LRB): OPEN REDUCTION INTERNAL FIXATION (ORIF) ANKLE FRACTURE (Left)  Patient location during evaluation: PACU Anesthesia Type: General Level of consciousness: awake and alert Pain management: pain level controlled Vital Signs Assessment: post-procedure vital signs reviewed and stable Respiratory status: spontaneous breathing and respiratory function stable Cardiovascular status: stable Anesthetic complications: no    Last Vitals:  Filed Vitals:   06/25/15 1118  BP: 147/63  Pulse: 81  Temp: 37.2 C  Resp: 16    Last Pain:  Filed Vitals:   06/25/15 1120  PainSc: 1                  Blayde Bacigalupi K

## 2015-06-25 NOTE — Transfer of Care (Signed)
Immediate Anesthesia Transfer of Care Note  Patient: Debbie Ortega  Procedure(s) Performed: Procedure(s): OPEN REDUCTION INTERNAL FIXATION (ORIF) ANKLE FRACTURE (Left)  Patient Location: PACU  Anesthesia Type:General  Level of Consciousness: awake, oriented and patient cooperative  Airway & Oxygen Therapy: Patient Spontanous Breathing and Patient connected to face mask oxygen  Post-op Assessment: Report given to RN and Post -op Vital signs reviewed and stable  Post vital signs: Reviewed and stable  Last Vitals:  Filed Vitals:   06/25/15 1118  BP: 147/63  Pulse: 81  Temp: 37.2 C  Resp: 16    Last Pain:  Filed Vitals:   06/25/15 1120  PainSc: 1          Complications: No apparent anesthesia complications

## 2015-06-25 NOTE — Progress Notes (Signed)
Pt is nauseated.  Not due for nausea bed at this time.  Dr Rudene Christians notified.  A once of phenergan ordered

## 2015-06-25 NOTE — H&P (Signed)
Reviewed paper H+P, will be scanned into chart. No changes noted.  

## 2015-06-26 ENCOUNTER — Encounter: Payer: Self-pay | Admitting: Orthopedic Surgery

## 2015-06-26 DIAGNOSIS — S82842A Displaced bimalleolar fracture of left lower leg, initial encounter for closed fracture: Secondary | ICD-10-CM | POA: Diagnosis not present

## 2015-06-26 DIAGNOSIS — Z472 Encounter for removal of internal fixation device: Secondary | ICD-10-CM | POA: Diagnosis not present

## 2015-06-26 MED ORDER — ASPIRIN EC 325 MG PO TBEC: 325.0000 mg | DELAYED_RELEASE_TABLET | Freq: Every day | ORAL | Status: DC

## 2015-06-26 MED ORDER — PROMETHAZINE HCL 12.5 MG PO TABS: 12.5000 mg | ORAL_TABLET | Freq: Four times a day (QID) | ORAL | Status: DC | PRN

## 2015-06-26 MED ORDER — OXYCODONE HCL 5 MG PO TABS: 5.0000 mg | ORAL_TABLET | ORAL | Status: DC | PRN

## 2015-06-26 NOTE — Progress Notes (Signed)
Patient being discharged to home. IV removed, discharge and medication instructions given. Patient acknowledged understanding. Sister here to take her home.

## 2015-06-26 NOTE — Evaluation (Signed)
Physical Therapy Evaluation Patient Details Name: Debbie Ortega MRN: [id number] DOB: 06-Apr-1950 Today's Date: 06/26/2015   History of Present Illness  65 y/o female here post ankle fx and ORIF.  She had been trying to get around with crutches the last day or 2 and has fallen multiple times.  Clinical Impression  Pt was able to ambulate well with walker and overall feels much safer and more stable than with crutches.  She was able to maintain NWBing w/o issues and ultimately should be safe to go home with assist from family. Pt will likely need PT after ankle is healed and she is allowed to bear weight.      Follow Up Recommendations No PT follow up (will need PT once healed and allowed to weight-bear)    Equipment Recommendations  Rolling walker with 5" wheels    Recommendations for Other Services       Precautions / Restrictions Precautions Precautions: Fall Restrictions Weight Bearing Restrictions: Yes LLE Weight Bearing: Non weight bearing      Mobility  Bed Mobility Overal bed mobility: Independent                Transfers Overall transfer level: Modified independent Equipment used: Rolling walker (2 wheeled)             General transfer comment: Pt needing cues to use walker/UEs appropriate during transition, but overall is able to maintain NWBing w/o issue.  Ambulation/Gait Ambulation/Gait assistance: Min guard Ambulation Distance (Feet): 50 Feet Assistive device: Rolling walker (2 wheeled)       General Gait Details: Pt is able to keep weight off the L ankle w/ relative easy. She comments multiple times how much easier and more stable the walker is than crutches.  Pt with an expected amount of fatiuge, but ultimately she does well and is safe.  Stairs            Wheelchair Mobility    Modified Rankin (Stroke Patients Only)       Balance                                             Pertinent Vitals/Pain Pain  Assessment: 0-10 Pain Score: 5  (was 9/10 before she got pain meds)    Home Living Family/patient expects to be discharged to:: Private residence Living Arrangements: Non-relatives/Friends (nephew) Available Help at Discharge: Family   Home Access: Ramped entrance     Home Layout: One level [place]: Crutches      Prior Function Level of Independence: Independent         Comments: Pt apparently very active, able to do all she needed w/o issue     Hand Dominance        Extremity/Trunk Assessment   Upper Extremity Assessment: Overall WFL for tasks assessed;Generalized weakness           Lower Extremity Assessment:  (able to do L LE AROM SLR &hip ab/ad, min pain limited)         Communication   Communication: No difficulties  Cognition Arousal/Alertness: Awake/alert Behavior During Therapy: WFL for tasks assessed/performed Overall Cognitive Status: Within Functional Limits for tasks assessed                      General Comments      Exercises  Assessment/Plan    PT Assessment Patent does not need any further PT services  PT Diagnosis Difficulty walking   PT Problem List    PT Treatment Interventions     PT Goals (Current goals can be found in the Care Plan section) Acute Rehab PT Goals Patient Stated Goal: go home    Frequency  (eval only)   Barriers to discharge        Co-evaluation               End of Session Equipment Utilized During Treatment: Gait belt Activity Tolerance: Patient tolerated treatment well Patient left: with bed alarm set;with family/visitor present;with call bell/phone within reach Nurse Communication: Mobility status    Functional Assessment Tool Used: clinical judgement Functional Limitation: Mobility: Walking and moving around Mobility: Walking and Moving Around Current Status [id number]): At least 20 percent but less than 40 percent impaired, limited or restricted Mobility: Walking and  Moving Around Goal Status [phone]): At least 20 percent but less than 40 percent impaired, limited or restricted Mobility: Walking and Moving Around Discharge Status [phone]): At least 20 percent but less than 40 percent impaired, limited or restricted    Time: 0950-1010 PT Time Calculation (min) (ACUTE ONLY): 20 min   Charges:   PT Evaluation $PT Eval Low Complexity: 1 Procedure     PT G Codes:   PT G-Codes **NOT FOR INPATIENT CLASS** Functional Assessment Tool Used: clinical judgement Functional Limitation: Mobility: Walking and moving around Mobility: Walking and Moving Around Current Status [id number]): At least 20 percent but less than 40 percent impaired, limited or restricted Mobility: Walking and Moving Around Goal Status [phone]): At least 20 percent but less than 40 percent impaired, limited or restricted Mobility: Walking and Moving Around Discharge Status [phone]): At least 20 percent but less than 40 percent impaired, limited or restricted    Kreg Shropshire, DPT  06/26/2015, 12:02 PM

## 2015-06-26 NOTE — Care Management Note (Addendum)
Case Management Note  Patient Details  Name: Debbie Ortega MRN: [id number] Date of Birth: 05-04-1950  Subjective/Objective:                  Spoke with patient regarding discharge planning. Patient states her nephew lives with her and her sister is currently at bedside. She is waiting on pain meds currently. She has crutches at home but nothing else and has inquired about a rolling walker. PT pending. Her PCP is Dr. Vicente Masson. She is not sure if she will need HHPT due to non-weight bearing status.   Action/Plan: List of home health agencies provided to patient. Rolling walker requested from [place] [facility] for delivery to this room today. RNCM will continue to follow.   Expected Discharge Date:  06/26/15               Expected Discharge Plan:     In-House Referral:     Discharge planning Services     Post Acute Care Choice:  Home Health, Durable Medical Equipment Choice offered to:  Patient, Sibling  DME Arranged:    DME Agency:     HH Arranged:    [place] Agency:     Status of Service:  In process, will continue to follow  Medicare Important Message Given:    Date Medicare IM Given:    Medicare IM give by:    Date Additional Medicare IM Given:    Additional Medicare Important Message give by:     If discussed at [place] of Stay Meetings, dates discussed:    Additional Comments:  Marshell Garfinkel, RN 06/26/2015, 8:24 AM

## 2015-06-26 NOTE — Care Management (Signed)
Rolling walker delivered per patient. No anticipated HHPT needs per patient. Rx requested from [facility] for walker. No further RNCM needs.

## 2015-06-26 NOTE — Progress Notes (Signed)
   Subjective: 1 Day Post-Op Procedure(s) (LRB): OPEN REDUCTION INTERNAL FIXATION (ORIF) ANKLE FRACTURE (Left) Patient reports pain as moderate.   Patient is well, and has had no acute complaints or problems. Complaining of nausea last night, this has resolved with Phenergan. Denies any CP, SOB, ABD pain. We will continue therapy today.  Plan is to go Home after hospital stay.  Objective: Vital signs in last 24 hours: Temp:  [97 F (36.1 C)-100 F (37.8 C)] 98.7 F (37.1 C) (06/16 0733) Pulse Rate:  [73-99] 79 (06/16 0733) Resp:  [10-30] 18 (06/16 0733) BP: (101-158)/(43-85) 131/53 mmHg (06/16 0733) SpO2:  [93 %-100 %] 93 % (06/16 0733) Weight:  [74.844 kg (165 lb)] 74.844 kg (165 lb) (06/15 1118)  Intake/Output from previous day: 06/15 0701 - 06/16 0700 In: 1258.8 [I.V.:1158.8; IV Piggyback:100] Out: -  Intake/Output this shift:    No results for input(s): HGB in the last 72 hours. No results for input(s): WBC, RBC, HCT, PLT in the last 72 hours. No results for input(s): NA, K, CL, CO2, BUN, CREATININE, GLUCOSE, CALCIUM in the last 72 hours. No results for input(s): LABPT, INR in the last 72 hours.  EXAM General - Patient is Alert, Appropriate and Oriented Extremity - Neurovascular intact Sensation intact distally Intact pulses distally Splint intact, 2+ cap refill. Dressing - dressing C/D/I and no drainage Motor Function - intact,toes well on exam.   Past Medical History  Diagnosis Date  . Thyroid disease   . Osteoporosis   . Depression   . Hyperlipidemia   . Anxiety   . Joint pain     Assessment/Plan:   1 Day Post-Op Procedure(s) (LRB): OPEN REDUCTION INTERNAL FIXATION (ORIF) ANKLE FRACTURE (Left) Active Problems:   S/P ORIF (open reduction internal fixation) fracture  Estimated body mass index is 30.17 kg/(m^2) as calculated from the following:   Height as of this encounter: 5\' 2"  (1.575 m).   Weight as of this encounter: 74.844 kg (165 lb). Advance  diet Up with therapy  Plan I'll discharge to home today pending the progress with physical therapy. She will follow-up in the office and 3 days for dressing change and cast application.  DVT Prophylaxis - ASPIRIN Nonweightbearing left lower extremity. D/C O2 and Pulse OX and try on Room Air  T. Rachelle Hora, PA-C [place] 06/26/2015, 7:38 AM

## 2015-06-26 NOTE — Discharge Summary (Signed)
Physician Discharge Summary  Patient ID: Debbie Ortega MRN: [id number] DOB/AGE: 67-Jun-1952 67 y.o.  Admit date: 06/25/2015 Discharge date: 06/26/2015  Admission Diagnoses:  BIMALLEOLAR ANKLE FRACTURE CLOSED   Discharge Diagnoses: Patient Active Problem List   Diagnosis Date Noted  . S/P ORIF (open reduction internal fixation) fracture 06/25/2015  . Left knee pain 12/15/2014  . Osteoporosis 12/15/2014  . Hypothyroidism 12/15/2014  . Depression with anxiety 12/15/2014  . Hyperlipidemia 12/15/2014  . Hx of colonic polyps 12/15/2014  . FH: lung cancer 12/15/2014  . FH: Parkinson's disease 12/15/2014  . FH: leukemia 12/15/2014  . Obesity, Class I, BMI 30-34.9 12/15/2014  . Vitamin D deficiency 12/15/2014    Past Medical History  Diagnosis Date  . Thyroid disease   . Osteoporosis   . Depression   . Hyperlipidemia   . Anxiety   . Joint pain      Transfusion: None   Consultants (if any):    Discharged Condition: Improved  Hospital Course: Debbie Ortega is an 65 y.o. female who was admitted 06/25/2015 with a diagnosis of <principal problem not specified> and went to the operating room on 06/25/2015 and underwent the above named procedures.    Surgeries: Procedure(s): OPEN REDUCTION INTERNAL FIXATION (ORIF) ANKLE FRACTURE on 06/25/2015 Patient tolerated the surgery well. Taken to PACU where she was stabilized and then transferred to the orthopedic floor.  Physical therapy started on day #1 for gait training and transfer.  Patient's foley was d/c on day #1. Patient's IV was d/c on day #1. On post op day #1 patient was stable and ready for discharge to home.  Implants: Composite locking fibular plate with screws one 4.0 cannulated screw medial  She was given perioperative antibiotics:  Anti-infectives    Start     Dose/Rate Route Frequency Ordered Stop   06/25/15 1800  ceFAZolin (ANCEF) IVPB 1 g/50 mL premix     1 g 100 mL/hr over 30 Minutes Intravenous Every 6 hours  06/25/15 1716 06/26/15 0602   06/25/15 1115  ceFAZolin (ANCEF) IVPB 2g/100 mL premix     2 g 200 mL/hr over 30 Minutes Intravenous  Once 06/25/15 1108 06/25/15 1156   06/25/15 1113  ceFAZolin (ANCEF) 2-4 GM/100ML-% IVPB    Comments:  SCHNIER, JULIE: cabinet override      06/25/15 1113 06/25/15 2314    .  She was given sequential compression devices, early ambulation, and Aspirin for DVT prophylaxis.  She benefited maximally from the hospital stay and there were no complications.    Recent vital signs:  Filed Vitals:   06/26/15 0346 06/26/15 0733  BP: 101/45 131/53  Pulse: 75 79  Temp: 100 F (37.8 C) 98.7 F (37.1 C)  Resp: 19 18    Recent laboratory studies:  No results found for: HGB No results found for: WBC, PLT No results found for: INR No results found for: NA, K, CL, CO2, BUN, CREATININE, GLUCOSE  Discharge Medications:     Medication List    TAKE these medications        ALPRAZolam 0.25 MG tablet  Commonly known as:  XANAX  Take 0.25 mg by mouth every 6 (six) hours as needed.     aspirin EC 325 MG tablet  Take 1 tablet (325 mg total) by mouth daily.     cholecalciferol 400 units Tabs tablet  Commonly known as:  VITAMIN D  Take 400 Units by mouth daily.     levothyroxine 75 MCG tablet  Commonly known as:  SYNTHROID, LEVOTHROID  Take 1 tablet (75 mcg total) by mouth daily before breakfast.     multivitamin with minerals Tabs tablet  Take 1 tablet by mouth daily.     naproxen 500 MG tablet  Commonly known as:  NAPROSYN  Take 1 tablet (500 mg total) by mouth 2 (two) times daily with a meal.     oxyCODONE 5 MG immediate release tablet  Commonly known as:  ROXICODONE  Take 1 tablet (5 mg total) by mouth every 4 (four) hours as needed for severe pain.     oxyCODONE 5 MG immediate release tablet  Commonly known as:  Oxy IR/ROXICODONE  Take 1-2 tablets (5-10 mg total) by mouth every 4 (four) hours as needed for breakthrough pain.      oxyCODONE-acetaminophen 5-325 MG tablet  Commonly known as:  ROXICET  Take 1-2 tablets by mouth every 4 (four) hours as needed for severe pain.     promethazine 12.5 MG tablet  Commonly known as:  PHENERGAN  Take 1 tablet (12.5 mg total) by mouth every 6 (six) hours as needed for nausea or vomiting.     sertraline 50 MG tablet  Commonly known as:  ZOLOFT  Take 1 tablet (50 mg total) by mouth daily.     zoster vaccine live (PF) 19400 UNT/0.65ML injection  Commonly known as:  ZOSTAVAX  Inject 19,400 Units into the skin once.        Diagnostic Studies: Dg Ankle 2 Views Left  06/25/2015  CLINICAL DATA:  Postoperative left ankle open-reduction internal-fixation. EXAM: LEFT ANKLE - 2 VIEW COMPARISON:  Radiograph 06/21/2015 FINDINGS: There has been an interval side plate and screw fixation of oblique distal left fibular fracture with no evidence of hardware loosening or fracture and near anatomic alignment of the fibula. Single nail fixation is also seen of the medial malleolus with satisfactory alignment. Expected soft tissue swelling is seen. IMPRESSION: Status post fixation of medial and lateral left ankle malleolar fractures with near anatomic alignment and no evidence of immediate complications. Electronically Signed   By: Fidela Salisbury M.D.   On: 06/25/2015 14:16   Dg Ankle Complete Left  06/21/2015  CLINICAL DATA:  Fall down stairs, left ankle pain/swelling EXAM: LEFT ANKLE COMPLETE - 3+ VIEW COMPARISON:  None. FINDINGS: Minimally displaced oblique distal fibular fracture. Mildly displaced transverse medial malleolar fracture. Ankle mortise is otherwise preserved. The base of the fifth metatarsal is unremarkable. Mild soft tissue swelling, most prominent medially. IMPRESSION: Minimally displaced oblique distal fibular fracture. Mild displaced transverse medial malleolar fracture. Electronically Signed   By: Julian Hy M.D.   On: 06/21/2015 17:31    Disposition: 01-Home or Self  Care        Follow-up Information    Follow up with MENZ,MICHAEL, MD. Go on 06/29/2015.   Specialty:  Orthopedic Surgery   Why:  Should already have an appointment for Monday (Keep Appointment as Scheduled)    Contact information:   [place] [address] [phone]        Signed: Feliberto Gottron 06/26/2015, 7:45 AM

## 2015-06-29 DIAGNOSIS — Z8781 Personal history of (healed) traumatic fracture: Secondary | ICD-10-CM | POA: Diagnosis not present

## 2015-06-29 DIAGNOSIS — Z967 Presence of other bone and tendon implants: Secondary | ICD-10-CM | POA: Diagnosis not present

## 2015-07-10 DIAGNOSIS — Z8781 Personal history of (healed) traumatic fracture: Secondary | ICD-10-CM | POA: Diagnosis not present

## 2015-07-10 DIAGNOSIS — Z967 Presence of other bone and tendon implants: Secondary | ICD-10-CM | POA: Diagnosis not present

## 2015-07-24 DIAGNOSIS — Z967 Presence of other bone and tendon implants: Secondary | ICD-10-CM | POA: Diagnosis not present

## 2015-07-24 DIAGNOSIS — Z8781 Personal history of (healed) traumatic fracture: Secondary | ICD-10-CM | POA: Diagnosis not present

## 2015-07-27 ENCOUNTER — Other Ambulatory Visit: Payer: Self-pay | Admitting: Internal Medicine

## 2015-07-27 MED ORDER — LEVOTHYROXINE SODIUM 75 MCG PO TABS: 75.0000 ug | ORAL_TABLET | Freq: Every day | ORAL | Status: DC

## 2015-08-19 DIAGNOSIS — Z8781 Personal history of (healed) traumatic fracture: Secondary | ICD-10-CM | POA: Diagnosis not present

## 2015-08-19 DIAGNOSIS — Z967 Presence of other bone and tendon implants: Secondary | ICD-10-CM | POA: Diagnosis not present

## 2015-08-27 ENCOUNTER — Other Ambulatory Visit: Payer: Self-pay | Admitting: Internal Medicine

## 2015-09-01 ENCOUNTER — Other Ambulatory Visit: Payer: Self-pay | Admitting: Internal Medicine

## 2015-09-02 ENCOUNTER — Encounter: Payer: Self-pay | Admitting: Internal Medicine

## 2015-09-02 ENCOUNTER — Other Ambulatory Visit: Payer: Self-pay | Admitting: Internal Medicine

## 2015-09-02 ENCOUNTER — Ambulatory Visit (INDEPENDENT_AMBULATORY_CARE_PROVIDER_SITE_OTHER): Payer: Medicare Other | Admitting: Internal Medicine

## 2015-09-02 VITALS — BP 122/78 | HR 87 | Resp 16 | Ht 62.0 in | Wt 164.8 lb

## 2015-09-02 DIAGNOSIS — Z1231 Encounter for screening mammogram for malignant neoplasm of breast: Secondary | ICD-10-CM | POA: Diagnosis not present

## 2015-09-02 DIAGNOSIS — E039 Hypothyroidism, unspecified: Secondary | ICD-10-CM

## 2015-09-02 DIAGNOSIS — Z23 Encounter for immunization: Secondary | ICD-10-CM

## 2015-09-02 DIAGNOSIS — Z967 Presence of other bone and tendon implants: Secondary | ICD-10-CM

## 2015-09-02 DIAGNOSIS — Z8781 Personal history of (healed) traumatic fracture: Secondary | ICD-10-CM | POA: Diagnosis not present

## 2015-09-02 DIAGNOSIS — F418 Other specified anxiety disorders: Secondary | ICD-10-CM

## 2015-09-02 DIAGNOSIS — R1013 Epigastric pain: Secondary | ICD-10-CM

## 2015-09-02 DIAGNOSIS — Z9889 Other specified postprocedural states: Secondary | ICD-10-CM

## 2015-09-02 MED ORDER — RANITIDINE HCL 150 MG PO TABS: 150.0000 mg | ORAL_TABLET | Freq: Every day | ORAL | 0 refills | Status: DC

## 2015-09-02 NOTE — Patient Instructions (Addendum)
Tdap Vaccine (Tetanus, Diphtheria and Pertussis): What You Need to Know 1. Why get vaccinated? Tetanus, diphtheria and pertussis are very serious diseases. Tdap vaccine can protect us from these diseases. And, Tdap vaccine given to pregnant women can protect newborn babies against pertussis. TETANUS (Lockjaw) is rare in the [place] today. It causes painful muscle tightening and stiffness, usually all over the body.  It can lead to tightening of muscles in the head and neck so you can't open your mouth, swallow, or sometimes even breathe. Tetanus kills about 1 out of 10 people who are infected even after receiving the best medical care. DIPHTHERIA is also rare in the [place] today. It can cause a thick coating to form in the back of the throat.  It can lead to breathing problems, heart failure, paralysis, and death. PERTUSSIS (Whooping Cough) causes severe coughing spells, which can cause difficulty breathing, vomiting and disturbed sleep.  It can also lead to weight loss, incontinence, and rib fractures. Up to 2 in 100 adolescents and 5 in 100 adults with pertussis are hospitalized or have complications, which could include pneumonia or death. These diseases are caused by bacteria. Diphtheria and pertussis are spread from person to person through secretions from coughing or sneezing. Tetanus enters the body through cuts, scratches, or wounds. Before vaccines, as many as 200,000 cases of diphtheria, 200,000 cases of pertussis, and hundreds of cases of tetanus, were reported in the [place] each year. Since vaccination began, reports of cases for tetanus and diphtheria have dropped by about 99% and for pertussis by about 80%. 2. Tdap vaccine Tdap vaccine can protect adolescents and adults from tetanus, diphtheria, and pertussis. One dose of Tdap is routinely given at age 11 or 12. People who did not get Tdap at that age should get it as soon as possible. Tdap is especially important  for healthcare professionals and anyone having close contact with a baby younger than 12 months. Pregnant women should get a dose of Tdap during every pregnancy, to protect the newborn from pertussis. Infants are most at risk for severe, life-threatening complications from pertussis. Another vaccine, called Td, protects against tetanus and diphtheria, but not pertussis. A Td booster should be given every 10 years. Tdap may be given as one of these boosters if you have never gotten Tdap before. Tdap may also be given after a severe cut or burn to prevent tetanus infection. Your doctor or the person giving you the vaccine can give you more information. Tdap may safely be given at the same time as other vaccines. 3. Some people should not get this vaccine  A person who has ever had a life-threatening allergic reaction after a previous dose of any diphtheria, tetanus or pertussis containing vaccine, OR has a severe allergy to any part of this vaccine, should not get Tdap vaccine. Tell the person giving the vaccine about any severe allergies.  Anyone who had coma or long repeated seizures within 7 days after a childhood dose of DTP or DTaP, or a previous dose of Tdap, should not get Tdap, unless a cause other than the vaccine was found. They can still get Td.  Talk to your doctor if you:  have seizures or another nervous system problem,  had severe pain or swelling after any vaccine containing diphtheria, tetanus or pertussis,  ever had a condition called Guillain-Barr Syndrome (GBS),  aren't feeling well on the day the shot is scheduled. 4. Risks With any medicine, including vaccines, there is   a chance of side effects. These are usually mild and go away on their own. Serious reactions are also possible but are rare. Most people who get Tdap vaccine do not have any problems with it. Mild problems following Tdap (Did not interfere with activities)  Pain where the shot was given (about 3 in 4  adolescents or 2 in 3 adults)  Redness or swelling where the shot was given (about 1 person in 5)  Mild fever of at least 100.4F (up to about 1 in 25 adolescents or 1 in 100 adults)  Headache (about 3 or 4 people in 10)  Tiredness (about 1 person in 3 or 4)  Nausea, vomiting, diarrhea, stomach ache (up to 1 in 4 adolescents or 1 in 10 adults)  Chills, sore joints (about 1 person in 10)  Body aches (about 1 person in 3 or 4)  Rash, swollen glands (uncommon) Moderate problems following Tdap (Interfered with activities, but did not require medical attention)  Pain where the shot was given (up to 1 in 5 or 6)  Redness or swelling where the shot was given (up to about 1 in 16 adolescents or 1 in 12 adults)  Fever over 102F (about 1 in 100 adolescents or 1 in 250 adults)  Headache (about 1 in 7 adolescents or 1 in 10 adults)  Nausea, vomiting, diarrhea, stomach ache (up to 1 or 3 people in 100)  Swelling of the entire arm where the shot was given (up to about 1 in 500). Severe problems following Tdap (Unable to perform usual activities; required medical attention)  Swelling, severe pain, bleeding and redness in the arm where the shot was given (rare). Problems that could happen after any vaccine:  People sometimes faint after a medical procedure, including vaccination. Sitting or lying down for about 15 minutes can help prevent fainting, and injuries caused by a fall. Tell your doctor if you feel dizzy, or have vision changes or ringing in the ears.  Some people get severe pain in the shoulder and have difficulty moving the arm where a shot was given. This happens very rarely.  Any medication can cause a severe allergic reaction. Such reactions from a vaccine are very rare, estimated at fewer than 1 in a million doses, and would happen within a few minutes to a few hours after the vaccination. As with any medicine, there is a very remote chance of a vaccine causing a serious  injury or death. The safety of vaccines is always being monitored. For more information, visit: www.cdc.gov/vaccinesafety/ 5. What if there is a serious problem? What should I look for?  Look for anything that concerns you, such as signs of a severe allergic reaction, very high fever, or unusual behavior.  Signs of a severe allergic reaction can include hives, swelling of the face and throat, difficulty breathing, a fast heartbeat, dizziness, and weakness. These would usually start a few minutes to a few hours after the vaccination. What should I do?  If you think it is a severe allergic reaction or other emergency that can't wait, call 9-1-1 or get the person to the nearest hospital. Otherwise, call your doctor.  Afterward, the reaction should be reported to the Vaccine Adverse Event Reporting System (VAERS). Your doctor might file this report, or you can do it yourself through the VAERS web site at www.vaers.hhs.gov, or by calling 1-800-822-7967. VAERS does not give medical advice.  6. The National Vaccine Injury Compensation Program The National Vaccine Injury Compensation Program (  VICP) is a federal program that was created to compensate people who may have been injured by certain vaccines. Persons who believe they may have been injured by a vaccine can learn about the program and about filing a claim by calling 1-800-338-2382 or visiting the VICP website at www.hrsa.gov/vaccinecompensation. There is a time limit to file a claim for compensation. 7. How can I learn more?  Ask your doctor. He or she can give you the vaccine package insert or suggest other sources of information.  Call your local or state health department.  Contact the Centers for Disease Control and Prevention (CDC):  Call 1-800-232-4636 (1-800-CDC-INFO) or  Visit CDC's website at www.cdc.gov/vaccines CDC Tdap Vaccine VIS (03/05/13)   This information is not intended to replace advice given to you by your health care  provider. Make sure you discuss any questions you have with your health care provider.   Document Released: 06/28/2011 Document Revised: 01/17/2014 Document Reviewed: 04/10/2013 Elsevier Interactive Patient Education 2016 Elsevier Inc.  

## 2015-09-02 NOTE — Progress Notes (Signed)
Date:  09/02/2015   Name:  Debbie Ortega   DOB:  02-14-1950   MRN:  [id number]   Chief Complaint: Hypertension; Nausea (Since surgery she has felt sick to stomach.\); and Foot Swelling (foot swelling and causing pain to walk. )  Ankle fracture (left) - recovering well from ORIF in June. Getting PT and taking Advil as needed.  Still having some swelling in foot but improved with walking.  OA knee - has had an injection in the left knee in December.  It is bothering her more since walking differently with ankle fracture.  No redness or swelling.  Anxiety -  Taking Zoloft and doing well.  She has Xanax to use for severe anxiety - last time she took a dose was in June.  She still have plenty pills left in her last Rx.  Nausea - she has nausea and upset stomach intermittently.  She has been taking more Advil for the past 2 months.  She has some GERD as well for which she takes [place] or Pepto.  Review of Systems  Constitutional: Negative for unexpected weight change.  Eyes: Negative for visual disturbance.  Respiratory: Negative for cough, chest tightness and shortness of breath.   Cardiovascular: Positive for leg swelling. Negative for chest pain and palpitations.  Gastrointestinal: Positive for abdominal pain (heartburn) and nausea.  Musculoskeletal: Positive for arthralgias, gait problem and myalgias.  Hematological: Negative for adenopathy.  Psychiatric/Behavioral: Negative for decreased concentration, dysphoric mood and sleep disturbance. The patient is nervous/anxious.     Patient Active Problem List   Diagnosis Date Noted  . S/P ORIF (open reduction internal fixation) fracture 06/25/2015  . Left knee pain 12/15/2014  . Osteoporosis 12/15/2014  . Hypothyroidism 12/15/2014  . Depression with anxiety 12/15/2014  . Hyperlipidemia 12/15/2014  . Hx of colonic polyps 12/15/2014  . FH: lung cancer 12/15/2014  . FH: Parkinson's disease 12/15/2014  . FH: leukemia 12/15/2014  . Obesity,  Class I, BMI 30-34.9 12/15/2014  . Vitamin D deficiency 12/15/2014    Prior to Admission medications   Medication Sig Start Date End Date Taking? Authorizing Provider  ALPRAZolam (XANAX) 0.25 MG tablet Take 0.25 mg by mouth every 6 (six) hours as needed.   Yes Historical Provider, MD  levothyroxine (SYNTHROID, LEVOTHROID) 75 MCG tablet TAKE 1 TABLET (75 MCG TOTAL) BY MOUTH DAILY BEFORE BREAKFAST. 08/27/15  Yes Adline Potter, MD  Multiple Vitamin (MULTIVITAMIN WITH MINERALS) TABS tablet Take 1 tablet by mouth daily.   Yes Historical Provider, MD  promethazine (PHENERGAN) 12.5 MG suppository Place 12.5 mg rectally every 6 (six) hours as needed for nausea or vomiting.   Yes Historical Provider, MD  sertraline (ZOLOFT) 50 MG tablet Take 1 tablet (50 mg total) by mouth daily. 12/15/14  Yes Adline Potter, MD    No Known Allergies  Past Surgical History:  Procedure Laterality Date  . COLONOSCOPY  07/2014   normal cleared for 10 yrs  . ORIF ANKLE FRACTURE Left 06/25/2015   Procedure: OPEN REDUCTION INTERNAL FIXATION (ORIF) ANKLE FRACTURE;  Surgeon: Hessie Knows, MD;  Location: [facility] ORS;  Service: Orthopedics;  Laterality: Left;  . SKIN CANCER EXCISION     nose  . TUBAL LIGATION      Social History  Substance Use Topics  . Smoking status: Never Smoker  . Smokeless tobacco: Never Used  . Alcohol use 0.0 oz/week     Medication list has been reviewed and updated.   Physical Exam  Constitutional: She is oriented to  person, place, and time. She appears well-developed. No distress.  HENT:  Head: Normocephalic and atraumatic.  Cardiovascular: Normal rate, regular rhythm and normal heart sounds.   Pulmonary/Chest: Effort normal and breath sounds normal. No respiratory distress.  Abdominal: Soft. Normal appearance and bowel sounds are normal. There is no hepatosplenomegaly. There is no tenderness. There is no rebound and no CVA tenderness.  Musculoskeletal: Normal range of motion.        Feet:  Neurological: She is alert and oriented to person, place, and time.  Skin: Skin is warm and dry. No rash noted.  Psychiatric: She has a normal mood and affect. Her behavior is normal. Thought content normal.  Nursing note and vitals reviewed.   BP 122/78 (BP Location: Right Arm, Patient Position: Sitting, Cuff Size: Normal)   Pulse 87   Resp 16   Ht 5\' 2"  (1.575 m)   Wt 164 lb 12.8 oz (74.8 kg)   SpO2 98%   BMI 30.14 kg/m   Assessment and Plan: 1. Dyspepsia Begin ranitidine at hs - ranitidine (ZANTAC) 150 MG tablet; Take 1 tablet (150 mg total) by mouth at bedtime.  Dispense: 30 tablet; Refill: 0  2. Hypothyroidism, unspecified hypothyroidism type Supplemented; check labs - TSH  3. Depression with anxiety Continue medications  4. Encounter for screening mammogram for breast cancer - MM DIGITAL SCREENING BILATERAL; Future  5. Need for diphtheria-tetanus-pertussis (Tdap) vaccine - Tdap vaccine greater than or equal to 7yo IM  6. S/P ORIF (open reduction internal fixation) fracture Doing well - continue PTx and Advil as needed   Halina Maidens, MD [facility]  09/02/2015

## 2015-09-03 LAB — TSH: TSH: 5.54 u[IU]/mL — ABNORMAL HIGH (ref 0.450–4.500)

## 2015-09-07 ENCOUNTER — Other Ambulatory Visit: Payer: Self-pay | Admitting: *Deleted

## 2015-09-07 ENCOUNTER — Inpatient Hospital Stay
Admission: RE | Admit: 2015-09-07 | Discharge: 2015-09-07 | Disposition: A | Discharge status: Home / Self Care | Payer: Self-pay | Source: Ambulatory Visit | Attending: *Deleted | Admitting: *Deleted

## 2015-09-07 DIAGNOSIS — Z9289 Personal history of other medical treatment: Secondary | ICD-10-CM

## 2015-09-07 DIAGNOSIS — Z8781 Personal history of (healed) traumatic fracture: Secondary | ICD-10-CM | POA: Diagnosis not present

## 2015-09-07 DIAGNOSIS — Z967 Presence of other bone and tendon implants: Secondary | ICD-10-CM | POA: Diagnosis not present

## 2015-09-16 ENCOUNTER — Ambulatory Visit
Admission: RE | Admit: 2015-09-16 | Discharge: 2015-09-16 | Disposition: A | Discharge status: Home / Self Care | Payer: Medicare Other | Source: Ambulatory Visit | Attending: Internal Medicine | Admitting: Internal Medicine

## 2015-09-16 ENCOUNTER — Other Ambulatory Visit: Payer: Self-pay | Admitting: Internal Medicine

## 2015-09-16 DIAGNOSIS — Z1231 Encounter for screening mammogram for malignant neoplasm of breast: Secondary | ICD-10-CM

## 2015-09-16 HISTORY — DX: Malignant (primary) neoplasm, unspecified: C80.1

## 2015-09-17 DIAGNOSIS — Z967 Presence of other bone and tendon implants: Secondary | ICD-10-CM | POA: Diagnosis not present

## 2015-09-17 DIAGNOSIS — Z8781 Personal history of (healed) traumatic fracture: Secondary | ICD-10-CM | POA: Diagnosis not present

## 2015-09-18 DIAGNOSIS — Z8781 Personal history of (healed) traumatic fracture: Secondary | ICD-10-CM | POA: Diagnosis not present

## 2015-09-18 DIAGNOSIS — Z967 Presence of other bone and tendon implants: Secondary | ICD-10-CM | POA: Diagnosis not present

## 2015-09-22 DIAGNOSIS — Z8781 Personal history of (healed) traumatic fracture: Secondary | ICD-10-CM | POA: Diagnosis not present

## 2015-09-22 DIAGNOSIS — Z967 Presence of other bone and tendon implants: Secondary | ICD-10-CM | POA: Diagnosis not present

## 2015-09-24 DIAGNOSIS — Z967 Presence of other bone and tendon implants: Secondary | ICD-10-CM | POA: Diagnosis not present

## 2015-09-24 DIAGNOSIS — Z8781 Personal history of (healed) traumatic fracture: Secondary | ICD-10-CM | POA: Diagnosis not present

## 2015-09-30 DIAGNOSIS — Z8781 Personal history of (healed) traumatic fracture: Secondary | ICD-10-CM | POA: Diagnosis not present

## 2015-09-30 DIAGNOSIS — Z967 Presence of other bone and tendon implants: Secondary | ICD-10-CM | POA: Diagnosis not present

## 2015-10-02 DIAGNOSIS — Z8781 Personal history of (healed) traumatic fracture: Secondary | ICD-10-CM | POA: Diagnosis not present

## 2015-10-02 DIAGNOSIS — Z967 Presence of other bone and tendon implants: Secondary | ICD-10-CM | POA: Diagnosis not present

## 2015-10-06 DIAGNOSIS — Z967 Presence of other bone and tendon implants: Secondary | ICD-10-CM | POA: Diagnosis not present

## 2015-10-06 DIAGNOSIS — Z8781 Personal history of (healed) traumatic fracture: Secondary | ICD-10-CM | POA: Diagnosis not present

## 2015-10-31 DIAGNOSIS — Z23 Encounter for immunization: Secondary | ICD-10-CM | POA: Diagnosis not present

## 2015-12-17 ENCOUNTER — Other Ambulatory Visit: Payer: Self-pay | Admitting: Family Medicine

## 2015-12-17 MED ORDER — SERTRALINE HCL 50 MG PO TABS: 50.0000 mg | ORAL_TABLET | Freq: Every day | ORAL | 0 refills | Status: DC

## 2015-12-24 ENCOUNTER — Encounter: Payer: Self-pay | Admitting: Family Medicine

## 2015-12-24 ENCOUNTER — Other Ambulatory Visit
Admission: RE | Admit: 2015-12-24 | Discharge: 2015-12-24 | Disposition: A | Discharge status: Home / Self Care | Payer: Medicare Other | Source: Ambulatory Visit | Attending: Family Medicine | Admitting: Family Medicine

## 2015-12-24 ENCOUNTER — Ambulatory Visit (INDEPENDENT_AMBULATORY_CARE_PROVIDER_SITE_OTHER): Payer: Medicare Other | Admitting: Family Medicine

## 2015-12-24 VITALS — BP 151/86 | HR 86 | Resp 16 | Ht 62.0 in | Wt 168.0 lb

## 2015-12-24 DIAGNOSIS — Z967 Presence of other bone and tendon implants: Secondary | ICD-10-CM

## 2015-12-24 DIAGNOSIS — E784 Other hyperlipidemia: Secondary | ICD-10-CM

## 2015-12-24 DIAGNOSIS — E559 Vitamin D deficiency, unspecified: Secondary | ICD-10-CM | POA: Insufficient documentation

## 2015-12-24 DIAGNOSIS — E7849 Other hyperlipidemia: Secondary | ICD-10-CM

## 2015-12-24 DIAGNOSIS — Z23 Encounter for immunization: Secondary | ICD-10-CM

## 2015-12-24 DIAGNOSIS — F418 Other specified anxiety disorders: Secondary | ICD-10-CM | POA: Diagnosis not present

## 2015-12-24 DIAGNOSIS — E039 Hypothyroidism, unspecified: Secondary | ICD-10-CM | POA: Insufficient documentation

## 2015-12-24 DIAGNOSIS — G8929 Other chronic pain: Secondary | ICD-10-CM | POA: Diagnosis not present

## 2015-12-24 DIAGNOSIS — Z85828 Personal history of other malignant neoplasm of skin: Secondary | ICD-10-CM | POA: Diagnosis not present

## 2015-12-24 DIAGNOSIS — Z0000 Encounter for general adult medical examination without abnormal findings: Secondary | ICD-10-CM | POA: Diagnosis not present

## 2015-12-24 DIAGNOSIS — M25562 Pain in left knee: Secondary | ICD-10-CM | POA: Diagnosis not present

## 2015-12-24 DIAGNOSIS — Z8781 Personal history of (healed) traumatic fracture: Secondary | ICD-10-CM | POA: Diagnosis not present

## 2015-12-24 DIAGNOSIS — E669 Obesity, unspecified: Secondary | ICD-10-CM | POA: Insufficient documentation

## 2015-12-24 DIAGNOSIS — Z9889 Other specified postprocedural states: Secondary | ICD-10-CM

## 2015-12-24 LAB — TSH: TSH: 2.762 u[IU]/mL (ref 0.350–4.500)

## 2015-12-24 LAB — LIPID PANEL
CHOL/HDL RATIO: 5.6 ratio
CHOLESTEROL: 265 mg/dL — AB (ref 0–200)
HDL: 47 mg/dL (ref >40)
LDL Cholesterol: 190 mg/dL — ABNORMAL HIGH (ref 0–99)
Triglycerides: 142 mg/dL (ref <150)
VLDL: 28 mg/dL (ref 0–40)

## 2015-12-24 MED ORDER — LEVOTHYROXINE SODIUM 75 MCG PO TABS: 75.0000 ug | ORAL_TABLET | Freq: Every day | ORAL | 3 refills | Status: DC

## 2015-12-24 MED ORDER — SERTRALINE HCL 50 MG PO TABS: 50.0000 mg | ORAL_TABLET | Freq: Every day | ORAL | 3 refills | Status: DC

## 2015-12-24 NOTE — Addendum Note (Signed)
Addended by: Theresia Majors A on: 12/24/2015 11:40 AM   Modules accepted: Orders

## 2015-12-24 NOTE — Progress Notes (Signed)
Date:  12/24/2015   Name:  Debbie Ortega   DOB:  26-Nov-1950   MRN:  [id number]  PCP:  Halina Maidens, MD    Chief Complaint: Medicare Wellness   History of Present Illness:  This is a 65 y.o. female seen for one year f/u. Has stopped multiple meds/supps since last visit. Taking MVI but not vit D supplement. Synthroid dose changed in March, TSH slightly high in August. Zoloft continues to help, taking Xanax only rarely. Got shot in L knee last year, no pain since. Broke L ankle in June, s/p ORIF. Got tetanus and flu imms 09/02/15 at pharmacy, needs Pneumovax. Requests derm referral, hx facial BCC. Had normal mammo in September.  Review of Systems:  Review of Systems  Constitutional: Negative for activity change, appetite change and fever.  Respiratory: Negative for cough and shortness of breath.   Cardiovascular: Negative for chest pain and leg swelling.  Gastrointestinal: Negative for abdominal pain.  Genitourinary: Negative for difficulty urinating.  Neurological: Negative for dizziness and syncope.  Hematological: Negative for adenopathy.    Patient Active Problem List   Diagnosis Date Noted  . Hx of basal cell carcinoma 12/24/2015  . S/P ORIF (open reduction internal fixation) fracture 06/25/2015  . Left knee pain 12/15/2014  . Osteoporosis 12/15/2014  . Hypothyroidism 12/15/2014  . Depression with anxiety 12/15/2014  . Hyperlipidemia 12/15/2014  . Hx of colonic polyps 12/15/2014  . FH: lung cancer 12/15/2014  . FH: Parkinson's disease 12/15/2014  . FH: leukemia 12/15/2014  . Obesity, Class I, BMI 30-34.9 12/15/2014  . Vitamin D deficiency 12/15/2014    Prior to Admission medications   Medication Sig Start Date End Date Taking? Authorizing Provider  ALPRAZolam (XANAX) 0.25 MG tablet Take 0.25 mg by mouth every 6 (six) hours as needed.   Yes Historical Provider, MD  levothyroxine (SYNTHROID, LEVOTHROID) 75 MCG tablet Take 1 tablet (75 mcg total) by mouth daily before  breakfast. 12/24/15  Yes Adline Potter, MD  Multiple Vitamin (MULTIVITAMIN WITH MINERALS) TABS tablet Take 1 tablet by mouth daily.   Yes Historical Provider, MD  sertraline (ZOLOFT) 50 MG tablet Take 1 tablet (50 mg total) by mouth daily. 12/24/15  Yes Adline Potter, MD    No Known Allergies  Past Surgical History:  Procedure Laterality Date  . COLONOSCOPY  07/2014   normal cleared for 10 yrs  . ORIF ANKLE FRACTURE Left 06/25/2015   Procedure: OPEN REDUCTION INTERNAL FIXATION (ORIF) ANKLE FRACTURE;  Surgeon: Hessie Knows, MD;  Location: [facility] ORS;  Service: Orthopedics;  Laterality: Left;  . SKIN CANCER EXCISION     nose  . TUBAL LIGATION      Social History  Substance Use Topics  . Smoking status: Never Smoker  . Smokeless tobacco: Never Used  . Alcohol use 0.0 oz/week    Family History  Problem Relation Age of Onset  . Breast cancer Maternal Grandmother 72    Medication list has been reviewed and updated.  Physical Examination: BP (!) 151/86   Pulse 86   Resp 16   Ht 5\' 2"  (1.575 m)   Wt 168 lb (76.2 kg)   SpO2 98%   BMI 30.73 kg/m   Physical Exam  Constitutional: She appears well-developed and well-nourished.  Cardiovascular: Normal rate, regular rhythm and normal heart sounds.   Pulmonary/Chest: Effort normal and breath sounds normal.  Musculoskeletal: She exhibits no edema.  L ankle good ROM  Neurological: She is alert.  Skin: Skin is warm and dry.  Psychiatric: She has a normal mood and affect. Her behavior is normal.  Nursing note and vitals reviewed.   Assessment and Plan:  1. Hypothyroidism, unspecified type Refill current dose levothyroxine  - TSH  2. Vitamin D deficiency Off supplement except MVI - Vitamin D (25 hydroxy)  3. Chronic pain of left knee Resolved s/p injection 12/2014  4. Depression with anxiety Stable, refill Zoloft  5. Other hyperlipidemia Off Lipitor - Lipid Profile  6. Obesity, Class I, BMI 30-34.9 Exercise/weight  loss discussed  7. S/P ORIF (open reduction internal fixation) fracture L ankle Well healed  8. Hx of basal cell carcinoma - Ambulatory referral to Dermatology  9. Healthcare maintenance - Hepatitis C Antibody Pneumovax given Discussed no indication for continued Pap smears  Return in about 1 year (around 12/23/2016).  Satira Anis. [facility]  12/24/2015

## 2015-12-25 LAB — HEPATITIS C ANTIBODY: HCV Ab: <0.1 s/co ratio (ref 0.0–0.9)

## 2015-12-25 LAB — VITAMIN D 25 HYDROXY (VIT D DEFICIENCY, FRACTURES): Vit D, 25-Hydroxy: 44.1 ng/mL (ref 30.0–100.0)

## 2016-02-05 ENCOUNTER — Telehealth: Payer: Self-pay

## 2016-02-05 ENCOUNTER — Other Ambulatory Visit: Payer: Self-pay | Admitting: Family Medicine

## 2016-02-05 MED ORDER — SERTRALINE HCL 50 MG PO TABS: 50.0000 mg | ORAL_TABLET | Freq: Every day | ORAL | 2 refills | Status: DC

## 2016-02-05 NOTE — Telephone Encounter (Signed)
Advised about refills and gave message to call skin center

## 2016-02-05 NOTE — Telephone Encounter (Signed)
Insurance denied 90 day Sertraline can we call in 30 day?

## 2016-02-05 NOTE — Telephone Encounter (Signed)
Done

## 2016-04-20 DIAGNOSIS — K1321 Leukoplakia of oral mucosa, including tongue: Secondary | ICD-10-CM | POA: Diagnosis not present

## 2016-05-25 DIAGNOSIS — L820 Inflamed seborrheic keratosis: Secondary | ICD-10-CM | POA: Diagnosis not present

## 2016-05-25 DIAGNOSIS — D485 Neoplasm of uncertain behavior of skin: Secondary | ICD-10-CM | POA: Diagnosis not present

## 2016-05-25 DIAGNOSIS — L570 Actinic keratosis: Secondary | ICD-10-CM | POA: Diagnosis not present

## 2016-05-25 DIAGNOSIS — D1800 Hemangioma unspecified site: Secondary | ICD-10-CM | POA: Diagnosis not present

## 2016-05-25 DIAGNOSIS — B078 Other viral warts: Secondary | ICD-10-CM | POA: Diagnosis not present

## 2016-06-01 DIAGNOSIS — K1321 Leukoplakia of oral mucosa, including tongue: Secondary | ICD-10-CM | POA: Diagnosis not present

## 2016-09-01 DIAGNOSIS — K1321 Leukoplakia of oral mucosa, including tongue: Secondary | ICD-10-CM | POA: Diagnosis not present

## 2016-10-14 ENCOUNTER — Ambulatory Visit (INDEPENDENT_AMBULATORY_CARE_PROVIDER_SITE_OTHER): Payer: Medicare Other

## 2016-10-14 DIAGNOSIS — Z23 Encounter for immunization: Secondary | ICD-10-CM | POA: Diagnosis not present

## 2016-11-15 ENCOUNTER — Other Ambulatory Visit: Payer: Self-pay | Admitting: Family Medicine

## 2016-11-15 DIAGNOSIS — Z1231 Encounter for screening mammogram for malignant neoplasm of breast: Secondary | ICD-10-CM

## 2016-12-06 ENCOUNTER — Ambulatory Visit
Admission: RE | Admit: 2016-12-06 | Discharge: 2016-12-06 | Disposition: A | Discharge status: Home / Self Care | Payer: Medicare Other | Source: Ambulatory Visit | Attending: Family Medicine | Admitting: Family Medicine

## 2016-12-06 DIAGNOSIS — Z1231 Encounter for screening mammogram for malignant neoplasm of breast: Secondary | ICD-10-CM

## 2016-12-15 ENCOUNTER — Telehealth: Payer: Self-pay | Admitting: Family Medicine

## 2016-12-15 NOTE — Telephone Encounter (Signed)
Called to notify patient that her appt with Dr. Vicente Masson will not be a CPE but an OV. Left cb # [phone] knb

## 2016-12-26 ENCOUNTER — Encounter: Payer: Self-pay | Admitting: Family Medicine

## 2016-12-26 ENCOUNTER — Other Ambulatory Visit
Admission: RE | Admit: 2016-12-26 | Discharge: 2016-12-26 | Disposition: A | Discharge status: Home / Self Care | Payer: Medicare Other | Source: Ambulatory Visit | Attending: Family Medicine | Admitting: Family Medicine

## 2016-12-26 ENCOUNTER — Ambulatory Visit (INDEPENDENT_AMBULATORY_CARE_PROVIDER_SITE_OTHER): Payer: Medicare Other | Admitting: Family Medicine

## 2016-12-26 VITALS — BP 118/76 | HR 80 | Resp 16 | Ht 62.0 in | Wt 167.6 lb

## 2016-12-26 DIAGNOSIS — F418 Other specified anxiety disorders: Secondary | ICD-10-CM | POA: Diagnosis not present

## 2016-12-26 DIAGNOSIS — Z85828 Personal history of other malignant neoplasm of skin: Secondary | ICD-10-CM

## 2016-12-26 DIAGNOSIS — J309 Allergic rhinitis, unspecified: Secondary | ICD-10-CM | POA: Diagnosis not present

## 2016-12-26 DIAGNOSIS — E039 Hypothyroidism, unspecified: Secondary | ICD-10-CM | POA: Diagnosis not present

## 2016-12-26 DIAGNOSIS — E669 Obesity, unspecified: Secondary | ICD-10-CM

## 2016-12-26 DIAGNOSIS — E559 Vitamin D deficiency, unspecified: Secondary | ICD-10-CM | POA: Diagnosis not present

## 2016-12-26 DIAGNOSIS — Z86010 Personal history of colonic polyps: Secondary | ICD-10-CM

## 2016-12-26 LAB — TSH: TSH: 4.205 u[IU]/mL (ref 0.350–4.500)

## 2016-12-26 MED ORDER — SERTRALINE HCL 50 MG PO TABS: 50.0000 mg | ORAL_TABLET | Freq: Every day | ORAL | 3 refills | Status: DC

## 2016-12-28 ENCOUNTER — Other Ambulatory Visit: Payer: Self-pay | Admitting: Family Medicine

## 2016-12-28 DIAGNOSIS — J309 Allergic rhinitis, unspecified: Secondary | ICD-10-CM | POA: Insufficient documentation

## 2016-12-28 MED ORDER — LEVOTHYROXINE SODIUM 75 MCG PO TABS: 75.0000 ug | ORAL_TABLET | Freq: Every day | ORAL | 3 refills | Status: DC

## 2016-12-28 NOTE — Progress Notes (Signed)
Date:  12/26/2016   Name:  Debbie Ortega   DOB:  [birth year]   MRN:  [id number]  PCP:  Adline Potter, MD    Chief Complaint: Annual Exam   History of Present Illness:  This is a 66 y.o. female seen for one year f/u. Hypothyroid on Synthroid, depression/anxiety well controlled on Zoloft, feels she still needs, uses Xanax rarely. AR sxs well controlled on daily Zrytec. Colonoscopy 3 yrs ago showed polyps.   Review of Systems:  Review of Systems  Constitutional: Negative for fever.  Respiratory: Negative for cough and shortness of breath.   Cardiovascular: Negative for chest pain and leg swelling.  Genitourinary: Negative for difficulty urinating.  Neurological: Negative for syncope and light-headedness.    Patient Active Problem List   Diagnosis Date Noted  . Allergic rhinitis 12/28/2016  . Hx of basal cell carcinoma 12/24/2015  . S/P ORIF (open reduction internal fixation) fracture 06/25/2015  . Left knee pain 12/15/2014  . Osteoporosis 12/15/2014  . Hypothyroidism 12/15/2014  . Depression with anxiety 12/15/2014  . Hyperlipidemia 12/15/2014  . Hx of colonic polyps 12/15/2014  . FH: lung cancer 12/15/2014  . FH: Parkinson's disease 12/15/2014  . FH: leukemia 12/15/2014  . Obesity, Class I, BMI 30-34.9 12/15/2014  . Vitamin D deficiency 12/15/2014    Prior to Admission medications   Medication Sig Start Date End Date Taking? Authorizing Provider  ALPRAZolam [name]) 0.25 MG tablet Take 1 tablet by mouth daily as needed.   Yes [provider]  cetirizine (ZYRTEC) 10 MG tablet Take 10 mg by mouth daily.   Yes [provider]  levothyroxine (SYNTHROID, LEVOTHROID) 75 MCG tablet Take 1 tablet by mouth daily. 05/17/15  Yes [provider]  Multiple Vitamin (MULTIVITAMIN WITH MINERALS) TABS tablet Take 1 tablet by mouth daily.   Yes [provider]  sertraline (ZOLOFT) 50 MG tablet Take 1 tablet (50 mg total) by mouth daily. 12/26/16  Yes Katoria Yetman,  Gwyndolyn Saxon, MD    No Known Allergies  Past Surgical History:  Procedure Laterality Date  . COLONOSCOPY  07/2014   normal cleared for 10 yrs  . ORIF ANKLE FRACTURE Left 06/25/2015   Procedure: OPEN REDUCTION INTERNAL FIXATION (ORIF) ANKLE FRACTURE;  Surgeon: Hessie Knows, MD;  Location: [facility] ORS;  Service: Orthopedics;  Laterality: Left;  . SKIN CANCER EXCISION     nose  . TUBAL LIGATION      Social History   Tobacco Use  . Smoking status: Never Smoker  . Smokeless tobacco: Never Used  Substance Use Topics  . Alcohol use: Yes    Alcohol/week: 0.0 oz  . Drug use: No    Family History  Problem Relation Age of Onset  . Breast cancer Maternal Grandmother 65    Medication list has been reviewed and updated.  Physical Examination: BP 118/76   Pulse 80   Resp 16   Ht 5\' 2"  (1.575 m)   Wt 167 lb 9.6 oz (76 kg)   SpO2 96%   BMI 30.65 kg/m   Physical Exam  Constitutional: She appears well-developed and well-nourished.  Cardiovascular: Normal rate, regular rhythm and normal heart sounds.  Pulmonary/Chest: Effort normal and breath sounds normal.  Neurological: She is alert.  Skin: Skin is warm and dry.  Psychiatric: She has a normal mood and affect. Her behavior is normal.  Nursing note and vitals reviewed.   Assessment and Plan:  1. Hypothyroidism, unspecified type On Synthroid - TSH  2. Depression with anxiety Well controlled  on Zoloft (refill) and prn Xanax  3. Obesity, Class I, BMI 30-34.9 Weight unchanged, exercise/weight loss discussed  4. Allergic rhinitis, unspecified seasonality, unspecified trigger Well controlled on daily Zyrtec  5. Vitamin D deficiency Well controlled on supplement  6. Hx of colonic polyps  7. Hx of basal cell carcinoma  Return in about 1 year (around 12/26/2017).  Satira Anis. [facility]  12/28/2016

## 2017-03-27 DIAGNOSIS — K1321 Leukoplakia of oral mucosa, including tongue: Secondary | ICD-10-CM | POA: Diagnosis not present

## 2017-05-29 DIAGNOSIS — L578 Other skin changes due to chronic exposure to nonionizing radiation: Secondary | ICD-10-CM | POA: Diagnosis not present

## 2017-05-29 DIAGNOSIS — Z85828 Personal history of other malignant neoplasm of skin: Secondary | ICD-10-CM | POA: Diagnosis not present

## 2017-05-29 DIAGNOSIS — C44212 Basal cell carcinoma of skin of right ear and external auricular canal: Secondary | ICD-10-CM | POA: Diagnosis not present

## 2017-05-29 DIAGNOSIS — L718 Other rosacea: Secondary | ICD-10-CM | POA: Diagnosis not present

## 2017-05-29 DIAGNOSIS — D485 Neoplasm of uncertain behavior of skin: Secondary | ICD-10-CM | POA: Diagnosis not present

## 2017-06-16 DIAGNOSIS — C44212 Basal cell carcinoma of skin of right ear and external auricular canal: Secondary | ICD-10-CM | POA: Diagnosis not present

## 2017-07-27 DIAGNOSIS — K1321 Leukoplakia of oral mucosa, including tongue: Secondary | ICD-10-CM | POA: Diagnosis not present

## 2017-10-17 ENCOUNTER — Ambulatory Visit (INDEPENDENT_AMBULATORY_CARE_PROVIDER_SITE_OTHER): Payer: Medicare Other

## 2017-10-17 DIAGNOSIS — Z23 Encounter for immunization: Secondary | ICD-10-CM

## 2017-12-04 ENCOUNTER — Other Ambulatory Visit: Payer: Self-pay | Admitting: Internal Medicine

## 2017-12-04 DIAGNOSIS — Z1231 Encounter for screening mammogram for malignant neoplasm of breast: Secondary | ICD-10-CM

## 2017-12-18 ENCOUNTER — Ambulatory Visit
Admission: RE | Admit: 2017-12-18 | Discharge: 2017-12-18 | Disposition: A | Discharge status: Home / Self Care | Payer: Medicare Other | Source: Ambulatory Visit | Attending: Internal Medicine | Admitting: Internal Medicine

## 2017-12-18 DIAGNOSIS — Z1231 Encounter for screening mammogram for malignant neoplasm of breast: Secondary | ICD-10-CM | POA: Diagnosis not present

## 2017-12-25 DIAGNOSIS — L718 Other rosacea: Secondary | ICD-10-CM | POA: Diagnosis not present

## 2017-12-25 DIAGNOSIS — C44212 Basal cell carcinoma of skin of right ear and external auricular canal: Secondary | ICD-10-CM | POA: Diagnosis not present

## 2017-12-28 ENCOUNTER — Ambulatory Visit (INDEPENDENT_AMBULATORY_CARE_PROVIDER_SITE_OTHER): Payer: Medicare Other | Admitting: Internal Medicine

## 2017-12-28 ENCOUNTER — Other Ambulatory Visit
Admission: RE | Admit: 2017-12-28 | Discharge: 2017-12-28 | Disposition: A | Discharge status: Home / Self Care | Payer: Medicare Other | Attending: Internal Medicine | Admitting: Internal Medicine

## 2017-12-28 ENCOUNTER — Encounter: Payer: Self-pay | Admitting: Internal Medicine

## 2017-12-28 ENCOUNTER — Encounter: Payer: Medicare Other | Admitting: Family Medicine

## 2017-12-28 VITALS — BP 134/74 | HR 86 | Ht 62.0 in | Wt 178.0 lb

## 2017-12-28 DIAGNOSIS — E782 Mixed hyperlipidemia: Secondary | ICD-10-CM | POA: Diagnosis not present

## 2017-12-28 DIAGNOSIS — Z0000 Encounter for general adult medical examination without abnormal findings: Secondary | ICD-10-CM | POA: Diagnosis not present

## 2017-12-28 DIAGNOSIS — Z1231 Encounter for screening mammogram for malignant neoplasm of breast: Secondary | ICD-10-CM

## 2017-12-28 DIAGNOSIS — F325 Major depressive disorder, single episode, in full remission: Secondary | ICD-10-CM | POA: Diagnosis not present

## 2017-12-28 DIAGNOSIS — E559 Vitamin D deficiency, unspecified: Secondary | ICD-10-CM | POA: Insufficient documentation

## 2017-12-28 DIAGNOSIS — E039 Hypothyroidism, unspecified: Secondary | ICD-10-CM | POA: Insufficient documentation

## 2017-12-28 DIAGNOSIS — M810 Age-related osteoporosis without current pathological fracture: Secondary | ICD-10-CM

## 2017-12-28 LAB — POCT URINALYSIS DIPSTICK
Bilirubin, UA: NEGATIVE
Blood, UA: NEGATIVE
Glucose, UA: NEGATIVE
Ketones, UA: NEGATIVE
Leukocytes, UA: NEGATIVE
Nitrite, UA: NEGATIVE
PH UA: 7 (ref 5.0–8.0)
Protein, UA: NEGATIVE
Spec Grav, UA: <=1.005 — AB (ref 1.010–1.025)
UROBILINOGEN UA: 0.2 U/dL

## 2017-12-28 LAB — CBC WITH DIFFERENTIAL/PLATELET
Abs Immature Granulocytes: 0.01 10*3/uL (ref 0.00–0.07)
Basophils Absolute: 0.1 10*3/uL (ref 0.0–0.1)
Basophils Relative: 1 %
Eosinophils Absolute: 0.1 10*3/uL (ref 0.0–0.5)
Eosinophils Relative: 2 %
HCT: 43 % (ref 36.0–46.0)
Hemoglobin: 14 g/dL (ref 12.0–15.0)
Immature Granulocytes: 0 %
Lymphocytes Relative: 22 %
Lymphs Abs: 1.4 10*3/uL (ref 0.7–4.0)
MCH: 30 pg (ref 26.0–34.0)
MCHC: 32.6 g/dL (ref 30.0–36.0)
MCV: 92.1 fL (ref 80.0–100.0)
Monocytes Absolute: 0.6 10*3/uL (ref 0.1–1.0)
Monocytes Relative: 9 %
NEUTROS ABS: 4 10*3/uL (ref 1.7–7.7)
Neutrophils Relative %: 66 %
Platelets: 269 10*3/uL (ref 150–400)
RBC: 4.67 MIL/uL (ref 3.87–5.11)
RDW: 13.8 % (ref 11.5–15.5)
WBC: 6.1 10*3/uL (ref 4.0–10.5)
nRBC: 0 % (ref 0.0–0.2)

## 2017-12-28 LAB — COMPREHENSIVE METABOLIC PANEL
ALT: 17 U/L (ref 0–44)
AST: 25 U/L (ref 15–41)
Albumin: 4.5 g/dL (ref 3.5–5.0)
Alkaline Phosphatase: 95 U/L (ref 38–126)
Anion gap: 10 (ref 5–15)
BUN: 17 mg/dL (ref 8–23)
CO2: 29 mmol/L (ref 22–32)
Calcium: 9.2 mg/dL (ref 8.9–10.3)
Chloride: 98 mmol/L (ref 98–111)
Creatinine, Ser: 0.83 mg/dL (ref 0.44–1.00)
GFR calc Af Amer: >60 mL/min (ref >60)
GFR calc non Af Amer: >60 mL/min (ref >60)
Glucose, Bld: 107 mg/dL — ABNORMAL HIGH (ref 70–99)
Potassium: 4 mmol/L (ref 3.5–5.1)
Sodium: 137 mmol/L (ref 135–145)
Total Bilirubin: 0.9 mg/dL (ref 0.3–1.2)
Total Protein: 8.2 g/dL — ABNORMAL HIGH (ref 6.5–8.1)

## 2017-12-28 LAB — LIPID PANEL
Cholesterol: 291 mg/dL — ABNORMAL HIGH (ref 0–200)
HDL: 43 mg/dL (ref >40)
LDL CALC: 223 mg/dL — AB (ref 0–99)
Total CHOL/HDL Ratio: 6.8 RATIO
Triglycerides: 124 mg/dL (ref <150)
VLDL: 25 mg/dL (ref 0–40)

## 2017-12-28 LAB — TSH: TSH: 5.901 u[IU]/mL — ABNORMAL HIGH (ref 0.350–4.500)

## 2017-12-28 MED ORDER — SERTRALINE HCL 50 MG PO TABS: 50.0000 mg | ORAL_TABLET | Freq: Every day | ORAL | 1 refills | Status: DC

## 2017-12-28 MED ORDER — LEVOTHYROXINE SODIUM 75 MCG PO TABS: 75.0000 ug | ORAL_TABLET | Freq: Every day | ORAL | 1 refills | Status: DC

## 2017-12-28 NOTE — Progress Notes (Signed)
Date:  12/28/2017   Name:  Debbie Ortega   DOB:  06-01-1950   MRN:  [id number]   Chief Complaint: Annual Exam (No breast exam. Just had Mammo. ) Debbie Ortega is a 67 y.o. female who presents today for her Complete Annual Exam. She feels well. She reports exercising nothing structured but stays active. She reports she is sleeping fairly well. She is up to date on immunizations.  She recently had mammogram.  She has no breast complaints.  Thyroid Problem  Presents for follow-up visit. Patient reports no anxiety, constipation, depressed mood, diarrhea, fatigue, hoarse voice, leg swelling, palpitations, tremors or weight loss. The symptoms have been stable. Her past medical history is significant for hyperlipidemia.  Depression         This is a chronic problem.  The problem has been resolved since onset.  Associated symptoms include no decreased concentration, no fatigue and no headaches.     The symptoms are aggravated by family issues and social issues.  Past treatments include SSRIs - Selective serotonin reuptake inhibitors.  Past medical history includes thyroid problem.   Hyperlipidemia  This is a chronic problem. The problem is uncontrolled. Pertinent negatives include no chest pain or shortness of breath. She is currently on no antihyperlipidemic treatment. Improvement on treatment: tried atorvastatin in the past but stopped by Dr. Vicente Masson.  OP - pt reports OP on DEXA at previous MD.  We do not have the records but she was started calcium and vitamin D and Fosamax.  Dr. Vicente Masson instructed her to stop all of these medications.  She has not had a repeat DEXA.  She did have an ankle fracture and ORIF in 2017. CRC screening - pt had colonoscopy in 2016 showing several polyps.  She was told to repeat in 5 yrs. She has a copy of the outside report at home that I request a copy of.  Review of Systems  Constitutional: Negative for chills, fatigue, fever and weight loss.  HENT: Negative for  congestion, hearing loss, hoarse voice, tinnitus, trouble swallowing and voice change.   Eyes: Negative for visual disturbance.  Respiratory: Negative for cough, chest tightness, shortness of breath and wheezing.   Cardiovascular: Negative for chest pain, palpitations and leg swelling.  Gastrointestinal: Negative for abdominal pain, blood in stool, constipation, diarrhea and vomiting.  Endocrine: Negative for polydipsia and polyuria.  Genitourinary: Negative for dysuria, frequency, genital sores, vaginal bleeding and vaginal discharge.  Musculoskeletal: Negative for arthralgias, gait problem and joint swelling.  Skin: Negative for color change and rash.  Neurological: Negative for dizziness, tremors, light-headedness and headaches.  Hematological: Negative for adenopathy. Does not bruise/bleed easily.  Psychiatric/Behavioral: Positive for depression. Negative for decreased concentration, dysphoric mood and sleep disturbance. The patient is not nervous/anxious.     Patient Active Problem List   Diagnosis Date Noted  . Allergic rhinitis 12/28/2016  . Hx of basal cell carcinoma 12/24/2015  . S/P ORIF (open reduction internal fixation) fracture 06/25/2015  . Left knee pain 12/15/2014  . Osteoporosis 12/15/2014  . Acquired hypothyroidism 12/15/2014  . Depression, major, single episode, complete remission ([place]) 12/15/2014  . Hyperlipidemia 12/15/2014  . Hx of colonic polyps 12/15/2014  . FH: lung cancer 12/15/2014  . FH: Parkinson's disease 12/15/2014  . FH: leukemia 12/15/2014  . Obesity, Class I, BMI 30-34.9 12/15/2014  . Vitamin D deficiency 12/15/2014    No Known Allergies  Past Surgical History:  Procedure Laterality Date  . COLONOSCOPY  07/2014   normal  cleared for 10 yrs  . ORIF ANKLE FRACTURE Left 06/25/2015   Procedure: OPEN REDUCTION INTERNAL FIXATION (ORIF) ANKLE FRACTURE;  Surgeon: Hessie Knows, MD;  Location: [facility] ORS;  Service: Orthopedics;  Laterality: Left;  . SKIN  CANCER EXCISION     nose  . TUBAL LIGATION      Social History   Tobacco Use  . Smoking status: Never Smoker  . Smokeless tobacco: Never Used  Substance Use Topics  . Alcohol use: Yes    Alcohol/week: 0.0 standard drinks  . Drug use: No     Medication list has been reviewed and updated.  Current Meds  Medication Sig  . cetirizine (ZYRTEC) 10 MG tablet Take 10 mg by mouth daily.  Marland Kitchen levothyroxine (SYNTHROID, LEVOTHROID) 75 MCG tablet Take 1 tablet (75 mcg total) by mouth daily.  . Multiple Vitamin (MULTIVITAMIN WITH MINERALS) TABS tablet Take 1 tablet by mouth daily.  . sertraline (ZOLOFT) 50 MG tablet Take 1 tablet (50 mg total) by mouth daily.    PHQ 2/9 Scores 12/28/2017 12/26/2016 12/24/2015 12/15/2014  PHQ - 2 Score 0 2 0 0  PHQ- 9 Score - 2 - -   Fall Risk  12/28/2017 12/26/2016 12/24/2015 12/15/2014  Falls in the past year? 0 No Yes No  Comment - - Ankle surgery after fall 4/17  -  Number falls in past yr: 0 - 2 or more -  Injury with Fall? 0 - Yes -  Follow up Falls evaluation completed - Falls prevention discussed -   Functional Status Survey: Is the patient deaf or have difficulty hearing?: No Does the patient have difficulty seeing, even when wearing glasses/contacts?: No Does the patient have difficulty concentrating, remembering, or making decisions?: No Does the patient have difficulty walking or climbing stairs?: No Does the patient have difficulty dressing or bathing?: No Does the patient have difficulty doing errands alone such as visiting a doctor's office or shopping?: No   Physical Exam Vitals signs and nursing note reviewed.  Constitutional:      General: She is not in acute distress.    Appearance: She is well-developed.  HENT:     Head: Normocephalic and atraumatic.     Right Ear: Tympanic membrane and ear canal normal.     Left Ear: Tympanic membrane and ear canal normal.     Nose:     Right Sinus: No maxillary sinus tenderness.     Left  Sinus: No maxillary sinus tenderness.     Mouth/Throat:     Pharynx: Uvula midline.  Eyes:     General: No scleral icterus.       Right eye: No discharge.        Left eye: No discharge.     Conjunctiva/sclera: Conjunctivae normal.  Neck:     Musculoskeletal: Normal range of motion. No erythema.     Thyroid: No thyromegaly.     Vascular: No carotid bruit.  Cardiovascular:     Rate and Rhythm: Normal rate and regular rhythm.     Pulses: Normal pulses.     Heart sounds: Normal heart sounds.  Pulmonary:     Effort: Pulmonary effort is normal. No respiratory distress.     Breath sounds: No wheezing.  Abdominal:     General: Bowel sounds are normal.     Palpations: Abdomen is soft.     Tenderness: There is no abdominal tenderness.  Musculoskeletal: Normal range of motion.  Lymphadenopathy:     Cervical: No cervical adenopathy.  Skin:    General: Skin is warm and dry.     Findings: No rash.  Neurological:     Mental Status: She is alert and oriented to person, place, and time.     Cranial Nerves: No cranial nerve deficit.     Sensory: No sensory deficit.     Deep Tendon Reflexes: Reflexes are normal and symmetric.  Psychiatric:        Attention and Perception: Attention normal.        Mood and Affect: Mood normal.        Speech: Speech normal.        Behavior: Behavior normal.        Thought Content: Thought content normal.        Judgment: Judgment normal.     BP 134/74 (BP Location: Right Arm, Patient Position: Sitting, Cuff Size: Normal)   Pulse 86   Ht 5\' 2"  (1.575 m)   Wt 178 lb (80.7 kg)   SpO2 97%   BMI 32.56 kg/m   Assessment and Plan: 1. Annual physical exam Immunizations are up to date Colonoscopy due 2021 - pt to bring copy of report - CBC with Differential/Platelet - Comprehensive metabolic panel - POCT urinalysis dipstick  2. Encounter for screening mammogram for breast cancer Recently completed  3. Acquired hypothyroidism supplemented -  levothyroxine (SYNTHROID, LEVOTHROID) 75 MCG tablet; Take 1 tablet (75 mcg total) by mouth daily.  Dispense: 90 tablet; Refill: 1 - TSH  4. Mixed hyperlipidemia Pt taken off of statin medication by previous MD Will recheck and advise - Lipid panel  5. Depression, major, single episode, complete remission ([place]) Doing well with good symptoms control - continue same therapy - sertraline (ZOLOFT) 50 MG tablet; Take 1 tablet (50 mg total) by mouth daily.  Dispense: 90 tablet; Refill: 1  6. Vitamin D deficiency Check levels and advise on supplements - VITAMIN D 25 Hydroxy (Vit-D Deficiency, Fractures)  7. Age-related osteoporosis without current pathological fracture Repeat DEXA - VITAMIN D 25 Hydroxy (Vit-D Deficiency, Fractures) - DG Bone Density; Future   Partially dictated using Editor, commissioning. Any errors are unintentional.  Halina Maidens, MD [facility]  12/28/2017

## 2017-12-29 LAB — VITAMIN D 25 HYDROXY (VIT D DEFICIENCY, FRACTURES): Vit D, 25-Hydroxy: 27.5 ng/mL — ABNORMAL LOW (ref 30.0–100.0)

## 2018-01-04 NOTE — Progress Notes (Signed)
Called and left VM informing labs are back. Told patient she can view these on my chart and if she has any questions to call our office back.

## 2018-01-25 DIAGNOSIS — K1321 Leukoplakia of oral mucosa, including tongue: Secondary | ICD-10-CM | POA: Diagnosis not present

## 2018-02-13 ENCOUNTER — Other Ambulatory Visit: Payer: Self-pay

## 2018-02-13 DIAGNOSIS — F325 Major depressive disorder, single episode, in full remission: Secondary | ICD-10-CM

## 2018-02-13 MED ORDER — SERTRALINE HCL 50 MG PO TABS: 50.0000 mg | ORAL_TABLET | Freq: Every day | ORAL | 1 refills | Status: DC

## 2018-02-20 ENCOUNTER — Other Ambulatory Visit: Payer: Self-pay | Admitting: Internal Medicine

## 2018-02-20 ENCOUNTER — Ambulatory Visit
Admission: RE | Admit: 2018-02-20 | Discharge: 2018-02-20 | Disposition: A | Discharge status: Home / Self Care | Payer: Medicare Other | Source: Ambulatory Visit | Attending: Internal Medicine | Admitting: Internal Medicine

## 2018-02-20 DIAGNOSIS — Z780 Asymptomatic menopausal state: Secondary | ICD-10-CM | POA: Diagnosis not present

## 2018-02-20 DIAGNOSIS — M810 Age-related osteoporosis without current pathological fracture: Secondary | ICD-10-CM | POA: Insufficient documentation

## 2018-02-20 DIAGNOSIS — M8589 Other specified disorders of bone density and structure, multiple sites: Secondary | ICD-10-CM | POA: Diagnosis not present

## 2018-05-31 DIAGNOSIS — L578 Other skin changes due to chronic exposure to nonionizing radiation: Secondary | ICD-10-CM | POA: Diagnosis not present

## 2018-05-31 DIAGNOSIS — B078 Other viral warts: Secondary | ICD-10-CM | POA: Diagnosis not present

## 2018-05-31 DIAGNOSIS — L821 Other seborrheic keratosis: Secondary | ICD-10-CM | POA: Diagnosis not present

## 2018-05-31 DIAGNOSIS — Z872 Personal history of diseases of the skin and subcutaneous tissue: Secondary | ICD-10-CM | POA: Diagnosis not present

## 2018-06-15 ENCOUNTER — Other Ambulatory Visit: Payer: Self-pay | Admitting: Internal Medicine

## 2018-06-15 DIAGNOSIS — E039 Hypothyroidism, unspecified: Secondary | ICD-10-CM

## 2018-08-11 ENCOUNTER — Other Ambulatory Visit: Payer: Self-pay | Admitting: Internal Medicine

## 2018-08-11 DIAGNOSIS — F325 Major depressive disorder, single episode, in full remission: Secondary | ICD-10-CM

## 2018-12-19 ENCOUNTER — Other Ambulatory Visit: Payer: Self-pay | Admitting: Internal Medicine

## 2018-12-19 DIAGNOSIS — E039 Hypothyroidism, unspecified: Secondary | ICD-10-CM

## 2019-01-01 ENCOUNTER — Encounter: Payer: Medicare Other | Admitting: Internal Medicine

## 2019-01-17 DIAGNOSIS — H3509 Other intraretinal microvascular abnormalities: Secondary | ICD-10-CM | POA: Diagnosis not present

## 2019-01-31 ENCOUNTER — Telehealth: Payer: Self-pay | Admitting: Internal Medicine

## 2019-01-31 NOTE — Telephone Encounter (Signed)
Called to schedule Medicare Annual Wellness Visit with Nurse Health Advisor, Kasey Uthus at [facility]. If patient returns call, please schedule AWV with [facility] any date on [facility] schedule (Mon or Wed)  Questions regarding scheduling, please call  [phone] or MS Teams > [email or web address]  Kathryn Brown  Care Guide . Embedded Care Coordination [place]  Care Management [email or web address]  [phone]     

## 2019-02-09 ENCOUNTER — Other Ambulatory Visit: Payer: Self-pay | Admitting: Internal Medicine

## 2019-02-09 DIAGNOSIS — F325 Major depressive disorder, single episode, in full remission: Secondary | ICD-10-CM

## 2019-02-11 ENCOUNTER — Ambulatory Visit (INDEPENDENT_AMBULATORY_CARE_PROVIDER_SITE_OTHER): Payer: Medicare Other | Admitting: Internal Medicine

## 2019-02-11 ENCOUNTER — Other Ambulatory Visit: Payer: Self-pay

## 2019-02-11 ENCOUNTER — Other Ambulatory Visit
Admission: RE | Admit: 2019-02-11 | Discharge: 2019-02-11 | Disposition: A | Discharge status: Home / Self Care | Payer: Medicare Other | Attending: Internal Medicine | Admitting: Internal Medicine

## 2019-02-11 ENCOUNTER — Encounter: Payer: Self-pay | Admitting: Internal Medicine

## 2019-02-11 VITALS — BP 118/64 | HR 87 | Temp 98.7°F | Ht 62.0 in | Wt 174.0 lb

## 2019-02-11 DIAGNOSIS — E782 Mixed hyperlipidemia: Secondary | ICD-10-CM | POA: Insufficient documentation

## 2019-02-11 DIAGNOSIS — Z1231 Encounter for screening mammogram for malignant neoplasm of breast: Secondary | ICD-10-CM

## 2019-02-11 DIAGNOSIS — Z23 Encounter for immunization: Secondary | ICD-10-CM

## 2019-02-11 DIAGNOSIS — E039 Hypothyroidism, unspecified: Secondary | ICD-10-CM

## 2019-02-11 DIAGNOSIS — F325 Major depressive disorder, single episode, in full remission: Secondary | ICD-10-CM | POA: Diagnosis not present

## 2019-02-11 DIAGNOSIS — Z0000 Encounter for general adult medical examination without abnormal findings: Secondary | ICD-10-CM | POA: Insufficient documentation

## 2019-02-11 DIAGNOSIS — M26621 Arthralgia of right temporomandibular joint: Secondary | ICD-10-CM

## 2019-02-11 LAB — T4, FREE: Free T4: 1.09 ng/dL (ref 0.61–1.12)

## 2019-02-11 LAB — POCT URINALYSIS DIPSTICK
Bilirubin, UA: NEGATIVE
Blood, UA: NEGATIVE
Glucose, UA: NEGATIVE
Ketones, UA: NEGATIVE
Leukocytes, UA: NEGATIVE
Nitrite, UA: NEGATIVE
Protein, UA: NEGATIVE
Spec Grav, UA: 1.015 (ref 1.010–1.025)
Urobilinogen, UA: 0.2 E.U./dL
pH, UA: 5 (ref 5.0–8.0)

## 2019-02-11 LAB — COMPREHENSIVE METABOLIC PANEL
ALT: 17 U/L (ref 0–44)
AST: 24 U/L (ref 15–41)
Albumin: 4.6 g/dL (ref 3.5–5.0)
Alkaline Phosphatase: 74 U/L (ref 38–126)
Anion gap: 12 (ref 5–15)
BUN: 16 mg/dL (ref 8–23)
CO2: 23 mmol/L (ref 22–32)
Calcium: 8.7 mg/dL — ABNORMAL LOW (ref 8.9–10.3)
Chloride: 101 mmol/L (ref 98–111)
Creatinine, Ser: 0.78 mg/dL (ref 0.44–1.00)
GFR calc Af Amer: >60 mL/min (ref >60)
GFR calc non Af Amer: >60 mL/min (ref >60)
Glucose, Bld: 103 mg/dL — ABNORMAL HIGH (ref 70–99)
Potassium: 3.9 mmol/L (ref 3.5–5.1)
Sodium: 136 mmol/L (ref 135–145)
Total Bilirubin: 0.8 mg/dL (ref 0.3–1.2)
Total Protein: 7.6 g/dL (ref 6.5–8.1)

## 2019-02-11 LAB — CBC WITH DIFFERENTIAL/PLATELET
Abs Immature Granulocytes: 0.01 10*3/uL (ref 0.00–0.07)
Basophils Absolute: 0.1 10*3/uL (ref 0.0–0.1)
Basophils Relative: 1 %
Eosinophils Absolute: 0.2 10*3/uL (ref 0.0–0.5)
Eosinophils Relative: 2 %
HCT: 43.4 % (ref 36.0–46.0)
Hemoglobin: 14.4 g/dL (ref 12.0–15.0)
Immature Granulocytes: 0 %
Lymphocytes Relative: 22 %
Lymphs Abs: 1.6 10*3/uL (ref 0.7–4.0)
MCH: 30.3 pg (ref 26.0–34.0)
MCHC: 33.2 g/dL (ref 30.0–36.0)
MCV: 91.2 fL (ref 80.0–100.0)
Monocytes Absolute: 0.7 10*3/uL (ref 0.1–1.0)
Monocytes Relative: 9 %
Neutro Abs: 4.8 10*3/uL (ref 1.7–7.7)
Neutrophils Relative %: 66 %
Platelets: 257 10*3/uL (ref 150–400)
RBC: 4.76 MIL/uL (ref 3.87–5.11)
RDW: 14.4 % (ref 11.5–15.5)
WBC: 7.3 10*3/uL (ref 4.0–10.5)
nRBC: 0 % (ref 0.0–0.2)

## 2019-02-11 LAB — TSH: TSH: 2.845 u[IU]/mL (ref 0.350–4.500)

## 2019-02-11 MED ORDER — CYCLOBENZAPRINE HCL 5 MG PO TABS: 5.0000 mg | ORAL_TABLET | Freq: Every evening | ORAL | 0 refills | Status: DC | PRN

## 2019-02-11 MED ORDER — SHINGRIX 50 MCG/0.5ML IM SUSR: 0.5000 mL | Freq: Once | INTRAMUSCULAR | 1 refills | Status: AC

## 2019-02-11 NOTE — Patient Instructions (Addendum)
Covid Vaccine locations: [employer] Dept. [place] [phone] [email or web address]   Temporomandibular Joint Syndrome  Temporomandibular joint syndrome (TMJ syndrome) is a condition that causes pain in the temporomandibular joints. These joints are located near your ears and allow your jaw to open and close. For people with TMJ syndrome, chewing, biting, or other movements of the jaw can be difficult or painful. TMJ syndrome is often mild and goes away within a few weeks. However, sometimes the condition becomes a long-term (chronic) problem. What are the causes? This condition may be caused by:  Grinding your teeth or clenching your jaw. Some people do this when they are under stress.  Arthritis.  Injury to the jaw.  Head or neck injury.  Teeth or dentures that are not aligned well. In some cases, the cause of TMJ syndrome may not be known. What are the signs or symptoms? The most common symptom of this condition is an aching pain on the side of the head in the area of the TMJ. Other symptoms may include:  Pain when moving your jaw, such as when chewing or biting.  Being unable to open your jaw all the way.  Making a clicking sound when you open your mouth.  Headache.  Earache.  Neck or shoulder pain. How is this diagnosed? This condition may be diagnosed based on:  Your symptoms and medical history.  A physical exam. Your health care provider may check the range of motion of your jaw.  Imaging tests, such as X-rays or an MRI. You may also need to see your dentist, who will determine if your teeth and jaw are lined up correctly. How is this treated? TMJ syndrome often goes away on its own. If treatment is needed, the options may include:  Eating soft foods and applying ice or heat.  Medicines to relieve pain or inflammation.  Medicines or massage to relax the muscles.  A splint, bite plate, or mouthpiece to prevent teeth grinding  or jaw clenching.  Relaxation techniques or counseling to help reduce stress.  A therapy for pain in which an electrical current is applied to the nerves through the skin (transcutaneous electrical nerve stimulation).  Acupuncture. This is sometimes helpful to relieve pain.  Jaw surgery. This is rarely needed. Follow these instructions at home:  Eating and drinking  Eat a soft diet if you are having trouble chewing.  Avoid foods that require a lot of chewing. Do not chew gum. General instructions  Take over-the-counter and prescription medicines only as told by your health care provider.  If directed, put ice on the painful area. ? Put ice in a plastic bag. ? Place a towel between your skin and the bag. ? Leave the ice on for 20 minutes, 2-3 times a day.  Apply a warm, wet cloth (warm compress) to the painful area as directed.  Massage your jaw area and do any jaw stretching exercises as told by your health care provider.  If you were given a splint, bite plate, or mouthpiece, wear it as told by your health care provider.  Keep all follow-up visits as told by your health care provider. This is important. Contact a health care provider if:  You are having trouble eating.  You have new or worsening symptoms. Get help right away if:  Your jaw locks open or closed. Summary  Temporomandibular joint syndrome (TMJ syndrome) is a condition that causes pain in the temporomandibular joints. These joints are located near your ears  and allow your jaw to open and close.  TMJ syndrome is often mild and goes away within a few weeks. However, sometimes the condition becomes a long-term (chronic) problem.  Symptoms include an aching pain on the side of the head in the area of the TMJ, pain when chewing or biting, and being unable to open your jaw all the way. You may also make a clicking sound when you open your mouth.  TMJ syndrome often goes away on its own. If treatment is needed,  it may include medicines to relieve pain, reduce inflammation, or relax the muscles. A splint, bite plate, or mouthpiece may also be used to prevent teeth grinding or jaw clenching. This information is not intended to replace advice given to you by your health care provider. Make sure you discuss any questions you have with your health care provider. Document Revised: 03/10/2017 Document Reviewed: 02/07/2017 Elsevier Patient Education  2020 [employer].

## 2019-02-11 NOTE — Progress Notes (Signed)
Date:  02/11/2019   Name:  Debbie Ortega   DOB:  12-07-1950   MRN:  [id number]   Chief Complaint: Annual Exam (Breast Exam. ) and Ear Pain (Right ear pain started 3 months ago with TMJ- was so bad at beginning that she could not open her jaw easily. Now her ear is having pain. Tried treating her ear with Benadryl and only helped for a short period of time. ) Debbie Ortega is a 69 y.o. female who presents today for her Complete Annual Exam. She feels well. She reports exercising walking 3 miles twice a week. She reports she is sleeping well. She would like to get Shingrix vaccines.  Mammogram 12/2017 DEXA  02/2018 Colonoscopy  07/2014 repeat 2021  Immunization History  Administered Date(s) Administered  . DTaP 12/14/2005  . Fluad Quad(high Dose 65+) 10/14/2018  . Influenza, High Dose Seasonal PF 10/14/2016, 10/17/2017  . Influenza-Unspecified 10/24/2014  . Pneumococcal Conjugate-13 10/24/2014  . Pneumococcal Polysaccharide-23 12/24/2015  . Tdap 09/02/2015  . Zoster 12/15/2014    Thyroid Problem Presents for follow-up visit. Patient reports no anxiety, constipation, depressed mood, diarrhea, fatigue, hoarse voice, leg swelling, palpitations, tremors or weight gain. The symptoms have been stable.  Depression        This is a chronic problem.  The problem has been resolved since onset.  Associated symptoms include no fatigue and no headaches.  Past treatments include SSRIs - Selective serotonin reuptake inhibitors.  Compliance with treatment is good.  Past medical history includes thyroid problem.   Jaw pain - she had what she thinks is TMJ about three months ago.  There was no inciting event.  She took advil which helped.  She also had some right ear discomfort that was relieved by Benadryl.  Lab Results  Component Value Date   CREATININE 0.83 12/28/2017   BUN 17 12/28/2017   NA 137 12/28/2017   K 4.0 12/28/2017   CL 98 12/28/2017   CO2 29 12/28/2017   Lab Results  Component  Value Date   CHOL 291 (H) 12/28/2017   HDL 43 12/28/2017   LDLCALC 223 (H) 12/28/2017   TRIG 124 12/28/2017   CHOLHDL 6.8 12/28/2017   Lab Results  Component Value Date   TSH 5.901 (H) 12/28/2017   No results found for: HGBA1C   Review of Systems  Constitutional: Negative for chills, fatigue, fever and weight gain.  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 Negative for congestion, hoarse voice, tinnitus, trouble swallowing and voice change.   Eyes: Negative for visual disturbance.  Respiratory: Negative for cough, chest tightness, shortness of breath and wheezing.   Cardiovascular: Negative for chest pain, palpitations and leg swelling.  Gastrointestinal: Negative for abdominal pain, constipation, diarrhea and vomiting.  Endocrine: Negative for polydipsia and polyuria.  Genitourinary: Negative for dysuria, frequency, genital sores, vaginal bleeding and vaginal discharge.  Musculoskeletal: Positive for arthralgias (right sided TMJ pain). Negative for gait problem and joint swelling.  Skin: Negative for color change and rash.  Neurological: Negative for dizziness, tremors, light-headedness and headaches.  Hematological: Negative for adenopathy. Does not bruise/bleed easily.  Psychiatric/Behavioral: Positive for depression. Negative for dysphoric mood and sleep disturbance. The patient is not nervous/anxious.     Patient Active Problem List   Diagnosis Date Noted  . Allergic rhinitis 12/28/2016  . Hx of basal cell carcinoma 12/24/2015  . S/P ORIF (open reduction internal fixation) fracture 06/25/2015  . Left knee pain 12/15/2014  . Osteoporosis  12/15/2014  . Acquired hypothyroidism 12/15/2014  . Depression, major, single episode, complete remission ([place]) 12/15/2014  . Hyperlipidemia 12/15/2014  . Hx of colonic polyps 12/15/2014  . FH: lung cancer 12/15/2014  . FH: Parkinson's disease 12/15/2014  . FH: leukemia 12/15/2014  . Obesity, Class I, BMI  30-34.9 12/15/2014  . Vitamin D deficiency 12/15/2014    No Known Allergies  Past Surgical History:  Procedure Laterality Date  . COLONOSCOPY  07/2014   3 polyps - repeat 5 yrs  . ORIF ANKLE FRACTURE Left 06/25/2015   Procedure: OPEN REDUCTION INTERNAL FIXATION (ORIF) ANKLE FRACTURE;  Surgeon: Hessie Knows, MD;  Location: [facility] ORS;  Service: Orthopedics;  Laterality: Left;  . SKIN CANCER EXCISION     nose  . TUBAL LIGATION      Social History   Tobacco Use  . Smoking status: Never Smoker  . Smokeless tobacco: Never Used  Substance Use Topics  . Alcohol use: Yes    Alcohol/week: 0.0 standard drinks  . Drug use: No     Medication list has been reviewed and updated.  Current Meds  Medication Sig  . cetirizine (ZYRTEC) 10 MG tablet Take 10 mg by mouth daily.  Marland Kitchen levothyroxine (SYNTHROID) 75 MCG tablet TAKE 1 TABLET(75 MCG) BY MOUTH DAILY  . Multiple Vitamin (MULTIVITAMIN WITH MINERALS) TABS tablet Take 1 tablet by mouth daily.  . sertraline (ZOLOFT) 50 MG tablet TAKE 1 TABLET(50 MG) BY MOUTH DAILY    PHQ 2/9 Scores 02/11/2019 12/28/2017 12/26/2016 12/24/2015  PHQ - 2 Score 0 0 2 0  PHQ- 9 Score 0 - 2 -    BP Readings from Last 3 Encounters:  02/11/19 118/64  12/28/17 134/74  12/26/16 118/76    Physical Exam Vitals and nursing note reviewed.  Constitutional:      General: She is not in acute distress.    Appearance: She is well-developed.  HENT:     Head: Normocephalic and atraumatic.     Jaw: Pain on movement (on the right, no click or crepitus) present.     Right Ear: Tympanic membrane and ear canal normal.     Left Ear: Tympanic membrane and ear canal normal.     Nose:     Right Sinus: No maxillary sinus tenderness.     Left Sinus: No maxillary sinus tenderness.  Eyes:     General: No scleral icterus.       Right eye: No discharge.        Left eye: No discharge.     Conjunctiva/sclera: Conjunctivae normal.  Neck:     Thyroid: No thyromegaly.      Vascular: No carotid bruit.  Cardiovascular:     Rate and Rhythm: Normal rate and regular rhythm.     Pulses: Normal pulses.     Heart sounds: Normal heart sounds.  Pulmonary:     Effort: Pulmonary effort is normal. No respiratory distress.     Breath sounds: No wheezing.  Chest:     Breasts:        Right: No mass, nipple discharge, skin change or tenderness.        Left: No mass, nipple discharge, skin change or tenderness.  Abdominal:     General: Bowel sounds are normal.     Palpations: Abdomen is soft.     Tenderness: There is no abdominal tenderness.  Musculoskeletal:        General: Normal range of motion.     Cervical back: Normal range of motion.  No erythema.     Right lower leg: No edema.     Left lower leg: No edema.  Lymphadenopathy:     Cervical: No cervical adenopathy.  Skin:    General: Skin is warm and dry.     Capillary Refill: Capillary refill takes less than 2 seconds.     Findings: No rash.  Neurological:     General: No focal deficit present.     Mental Status: She is alert and oriented to person, place, and time.     Cranial Nerves: No cranial nerve deficit.     Sensory: No sensory deficit.     Deep Tendon Reflexes: Reflexes are normal and symmetric.  Psychiatric:        Attention and Perception: Attention normal.        Mood and Affect: Mood and affect normal.        Speech: Speech normal.        Behavior: Behavior normal. Behavior is cooperative.        Thought Content: Thought content normal.        Cognition and Memory: Cognition and memory normal.     Wt Readings from Last 3 Encounters:  02/11/19 174 lb (78.9 kg)  12/28/17 178 lb (80.7 kg)  12/26/16 167 lb 9.6 oz (76 kg)    BP 118/64   Pulse 87   Temp 98.7 F (37.1 C) (Oral)   Ht 5\' 2"  (1.575 m)   Wt 174 lb (78.9 kg)   SpO2 97%   BMI 31.83 kg/m   Assessment and Plan: 1. Annual physical exam Normal exam - continue regular exercise and healthy diet Recommend getting the Covid  vaccine before starting the Shingrix vaccines - CBC with Differential/Platelet - Comprehensive metabolic panel - POCT urinalysis dipstick  2. Encounter for screening mammogram for breast cancer Schedule at [place]; Future  3. Acquired hypothyroidism Supplemented without symptoms to suggest poor control - TSH + free T4  4. Depression, major, single episode, complete remission ([place]) Clinically stable and doing well on current therapy with Sertraline No SI/HI, very happy today - in a new relationship that is going well  5. Mixed hyperlipidemia Check labs and advise Previous 10 risk has been 11% and previously on Lipitor. - Lipid panel  6. Arthralgia of right temporomandibular joint Continue Advil or Aleve and add flexeril PRN - cyclobenzaprine (FLEXERIL) 5 MG tablet; Take 1 tablet (5 mg total) by mouth at bedtime as needed for muscle spasms. For TMJ pain  Dispense: 30 tablet; Refill: 0  7. Need for shingles vaccine - Zoster Vaccine Adjuvanted [facility]) injection; Inject 0.5 mLs into the muscle once for 1 dose.  Dispense: 0.5 mL; Refill: 1   Partially dictated using Editor, commissioning. Any errors are unintentional.  Halina Maidens, MD [facility]  02/11/2019

## 2019-02-12 ENCOUNTER — Other Ambulatory Visit: Payer: Self-pay

## 2019-02-12 LAB — LIPID PANEL
Cholesterol: 253 mg/dL — ABNORMAL HIGH (ref 0–200)
HDL: 47 mg/dL (ref >40)
LDL Cholesterol: 178 mg/dL — ABNORMAL HIGH (ref 0–99)
Total CHOL/HDL Ratio: 5.4 RATIO
Triglycerides: 140 mg/dL (ref <150)
VLDL: 28 mg/dL (ref 0–40)

## 2019-02-12 MED ORDER — BACLOFEN 10 MG PO TABS: 10.0000 mg | ORAL_TABLET | Freq: Every evening | ORAL | 0 refills | Status: DC | PRN

## 2019-03-29 DIAGNOSIS — I480 Paroxysmal atrial fibrillation: Secondary | ICD-10-CM | POA: Diagnosis not present

## 2019-03-29 DIAGNOSIS — H2511 Age-related nuclear cataract, right eye: Secondary | ICD-10-CM | POA: Diagnosis not present

## 2019-03-29 DIAGNOSIS — I5022 Chronic systolic (congestive) heart failure: Secondary | ICD-10-CM | POA: Diagnosis not present

## 2019-03-29 DIAGNOSIS — H25811 Combined forms of age-related cataract, right eye: Secondary | ICD-10-CM | POA: Diagnosis not present

## 2019-04-25 ENCOUNTER — Ambulatory Visit: Payer: Medicare Other

## 2019-04-26 DIAGNOSIS — H2512 Age-related nuclear cataract, left eye: Secondary | ICD-10-CM | POA: Diagnosis not present

## 2019-04-26 DIAGNOSIS — H25812 Combined forms of age-related cataract, left eye: Secondary | ICD-10-CM | POA: Diagnosis not present

## 2019-05-07 ENCOUNTER — Other Ambulatory Visit: Payer: Self-pay

## 2019-05-07 ENCOUNTER — Ambulatory Visit
Admission: RE | Admit: 2019-05-07 | Discharge: 2019-05-07 | Disposition: A | Discharge status: Home / Self Care | Payer: Medicare Other | Source: Ambulatory Visit | Attending: Internal Medicine | Admitting: Internal Medicine

## 2019-05-07 ENCOUNTER — Other Ambulatory Visit: Payer: Self-pay | Admitting: Internal Medicine

## 2019-05-07 DIAGNOSIS — F325 Major depressive disorder, single episode, in full remission: Secondary | ICD-10-CM

## 2019-05-07 DIAGNOSIS — Z1231 Encounter for screening mammogram for malignant neoplasm of breast: Secondary | ICD-10-CM | POA: Diagnosis not present

## 2019-05-17 ENCOUNTER — Other Ambulatory Visit: Payer: Self-pay | Admitting: Internal Medicine

## 2019-05-17 DIAGNOSIS — E039 Hypothyroidism, unspecified: Secondary | ICD-10-CM

## 2019-06-03 DIAGNOSIS — Z872 Personal history of diseases of the skin and subcutaneous tissue: Secondary | ICD-10-CM | POA: Diagnosis not present

## 2019-06-03 DIAGNOSIS — Z85828 Personal history of other malignant neoplasm of skin: Secondary | ICD-10-CM | POA: Diagnosis not present

## 2019-06-03 DIAGNOSIS — L718 Other rosacea: Secondary | ICD-10-CM | POA: Diagnosis not present

## 2019-06-03 DIAGNOSIS — L578 Other skin changes due to chronic exposure to nonionizing radiation: Secondary | ICD-10-CM | POA: Diagnosis not present

## 2019-07-29 DIAGNOSIS — Z131 Encounter for screening for diabetes mellitus: Secondary | ICD-10-CM | POA: Diagnosis not present

## 2019-07-29 DIAGNOSIS — E7800 Pure hypercholesterolemia, unspecified: Secondary | ICD-10-CM | POA: Diagnosis not present

## 2019-07-29 DIAGNOSIS — E039 Hypothyroidism, unspecified: Secondary | ICD-10-CM | POA: Diagnosis not present

## 2019-07-29 DIAGNOSIS — Z79899 Other long term (current) drug therapy: Secondary | ICD-10-CM | POA: Diagnosis not present

## 2019-07-29 DIAGNOSIS — Z113 Encounter for screening for infections with a predominantly sexual mode of transmission: Secondary | ICD-10-CM | POA: Diagnosis not present

## 2019-08-12 DIAGNOSIS — B078 Other viral warts: Secondary | ICD-10-CM | POA: Diagnosis not present

## 2019-08-12 DIAGNOSIS — E039 Hypothyroidism, unspecified: Secondary | ICD-10-CM | POA: Diagnosis not present

## 2019-08-12 DIAGNOSIS — E7800 Pure hypercholesterolemia, unspecified: Secondary | ICD-10-CM | POA: Diagnosis not present

## 2019-08-12 DIAGNOSIS — F329 Major depressive disorder, single episode, unspecified: Secondary | ICD-10-CM | POA: Diagnosis not present

## 2020-02-17 ENCOUNTER — Encounter: Payer: Medicare Other | Admitting: Internal Medicine

## 2020-06-09 ENCOUNTER — Other Ambulatory Visit: Payer: Self-pay | Admitting: Family Medicine

## 2020-06-11 ENCOUNTER — Other Ambulatory Visit: Payer: Self-pay | Admitting: Family Medicine

## 2020-06-11 DIAGNOSIS — Z1231 Encounter for screening mammogram for malignant neoplasm of breast: Secondary | ICD-10-CM

## 2020-06-23 ENCOUNTER — Ambulatory Visit
Admission: RE | Admit: 2020-06-23 | Discharge: 2020-06-23 | Disposition: A | Discharge status: Home / Self Care | Payer: Medicare Other | Source: Ambulatory Visit | Attending: Family Medicine | Admitting: Family Medicine

## 2020-06-23 ENCOUNTER — Other Ambulatory Visit: Payer: Self-pay

## 2020-06-23 DIAGNOSIS — Z1231 Encounter for screening mammogram for malignant neoplasm of breast: Secondary | ICD-10-CM | POA: Insufficient documentation

## 2021-08-17 ENCOUNTER — Other Ambulatory Visit: Payer: Self-pay | Admitting: Family Medicine

## 2021-08-17 DIAGNOSIS — Z1231 Encounter for screening mammogram for malignant neoplasm of breast: Secondary | ICD-10-CM

## 2021-09-02 ENCOUNTER — Ambulatory Visit
Admission: RE | Admit: 2021-09-02 | Discharge: 2021-09-02 | Disposition: A | Discharge status: Home / Self Care | Payer: Medicare Other | Source: Ambulatory Visit | Attending: Family Medicine | Admitting: Family Medicine

## 2021-09-02 DIAGNOSIS — Z1231 Encounter for screening mammogram for malignant neoplasm of breast: Secondary | ICD-10-CM | POA: Diagnosis not present

## 2021-10-19 DIAGNOSIS — L568 Other specified acute skin changes due to ultraviolet radiation: Secondary | ICD-10-CM | POA: Diagnosis not present

## 2021-10-19 DIAGNOSIS — Z85828 Personal history of other malignant neoplasm of skin: Secondary | ICD-10-CM | POA: Diagnosis not present

## 2021-10-19 DIAGNOSIS — Z1283 Encounter for screening for malignant neoplasm of skin: Secondary | ICD-10-CM | POA: Diagnosis not present

## 2021-10-19 DIAGNOSIS — L853 Xerosis cutis: Secondary | ICD-10-CM | POA: Diagnosis not present

## 2021-10-27 DIAGNOSIS — Z79899 Other long term (current) drug therapy: Secondary | ICD-10-CM | POA: Diagnosis not present

## 2021-10-27 DIAGNOSIS — R7303 Prediabetes: Secondary | ICD-10-CM | POA: Diagnosis not present

## 2021-10-27 DIAGNOSIS — E039 Hypothyroidism, unspecified: Secondary | ICD-10-CM | POA: Diagnosis not present

## 2021-10-27 DIAGNOSIS — E7800 Pure hypercholesterolemia, unspecified: Secondary | ICD-10-CM | POA: Diagnosis not present

## 2021-10-27 DIAGNOSIS — R030 Elevated blood-pressure reading, without diagnosis of hypertension: Secondary | ICD-10-CM | POA: Diagnosis not present

## 2021-11-23 DIAGNOSIS — L570 Actinic keratosis: Secondary | ICD-10-CM | POA: Diagnosis not present

## 2021-11-23 DIAGNOSIS — Z85828 Personal history of other malignant neoplasm of skin: Secondary | ICD-10-CM | POA: Diagnosis not present

## 2021-12-17 IMAGING — MG MM DIGITAL SCREENING BILAT W/ TOMO AND CAD
8 series · 8 of 24 positions shown · non-contrast
Comparison: Previous exam(s).

CLINICAL DATA: Screening.

EXAM:
DIGITAL SCREENING BILATERAL MAMMOGRAM WITH TOMOSYNTHESIS AND CAD
TECHNIQUE: Bilateral screening digital craniocaudal and mediolateral oblique
mammograms were obtained. Bilateral screening digital breast
tomosynthesis was performed. The images were evaluated with
computer-aided detection.

[L MLO synth-2D]
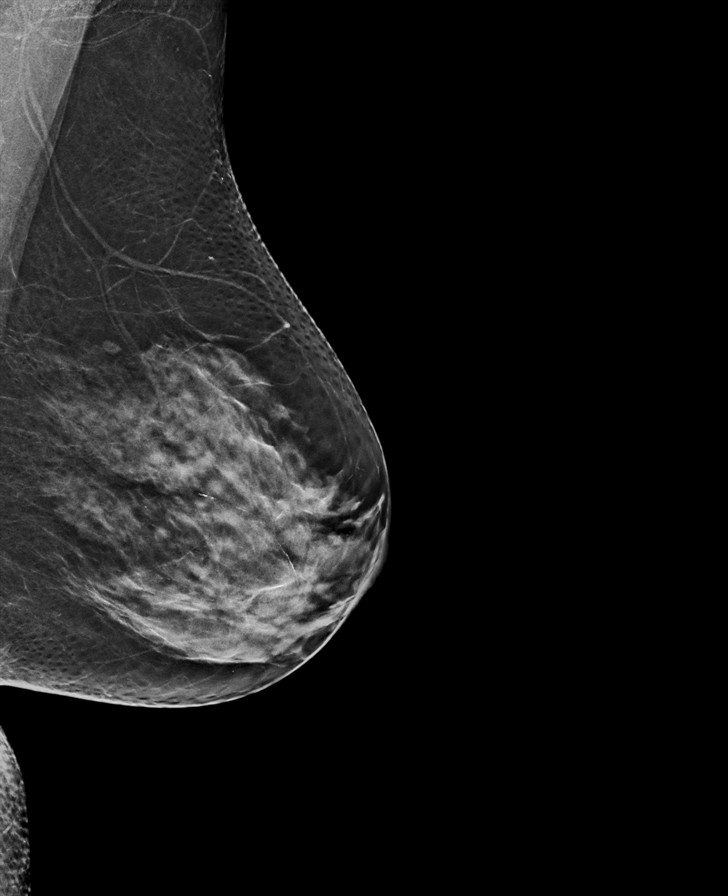

[R CC synth-2D]
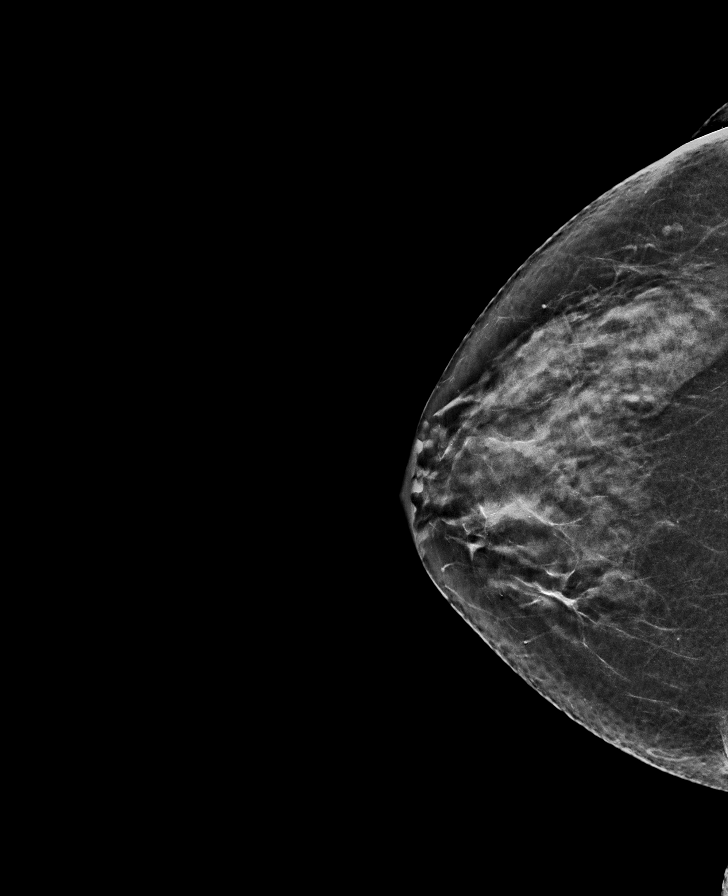

[L CC synth-2D]
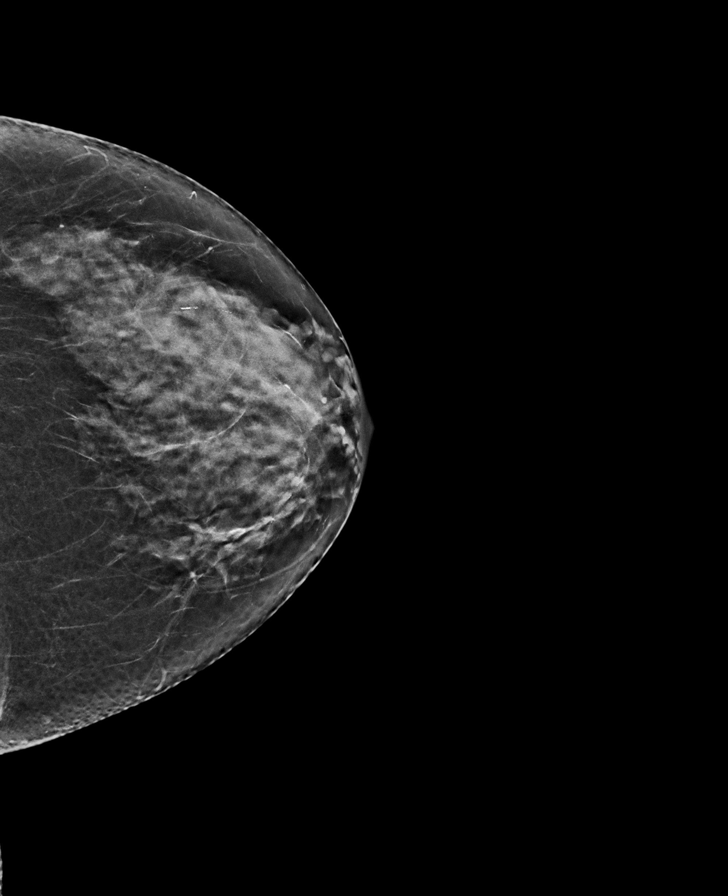

[R MLO synth-2D]
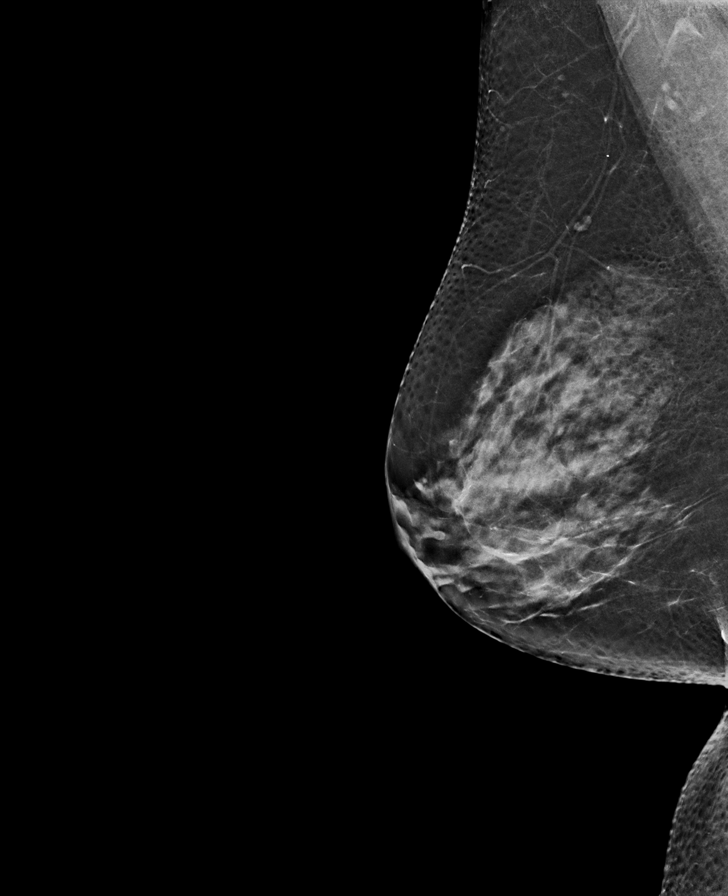

[R CC tomo · tomo slice 33/64.0]
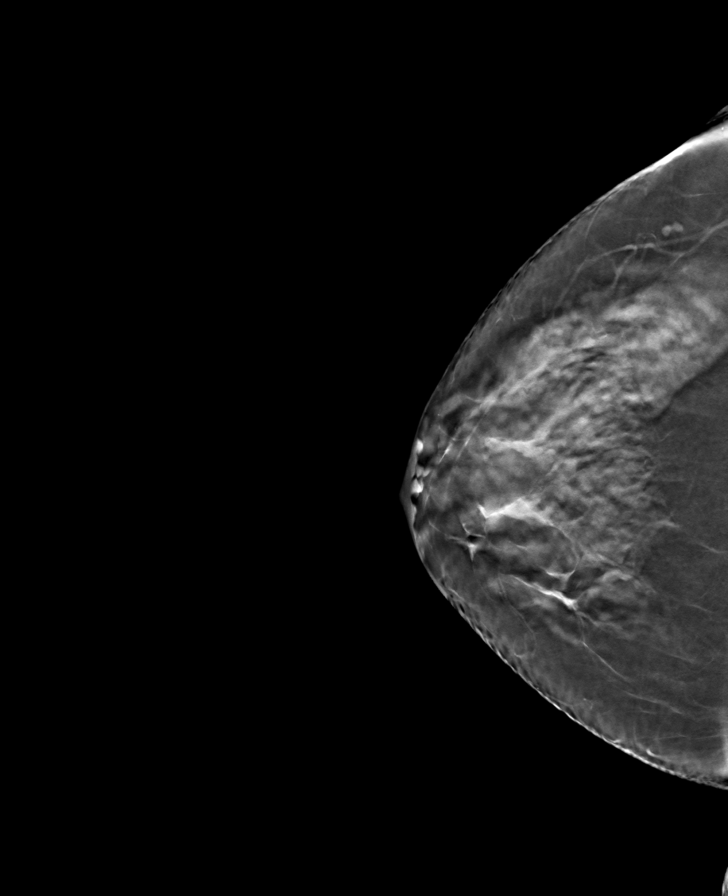

[L CC tomo · tomo slice 31/60.0]
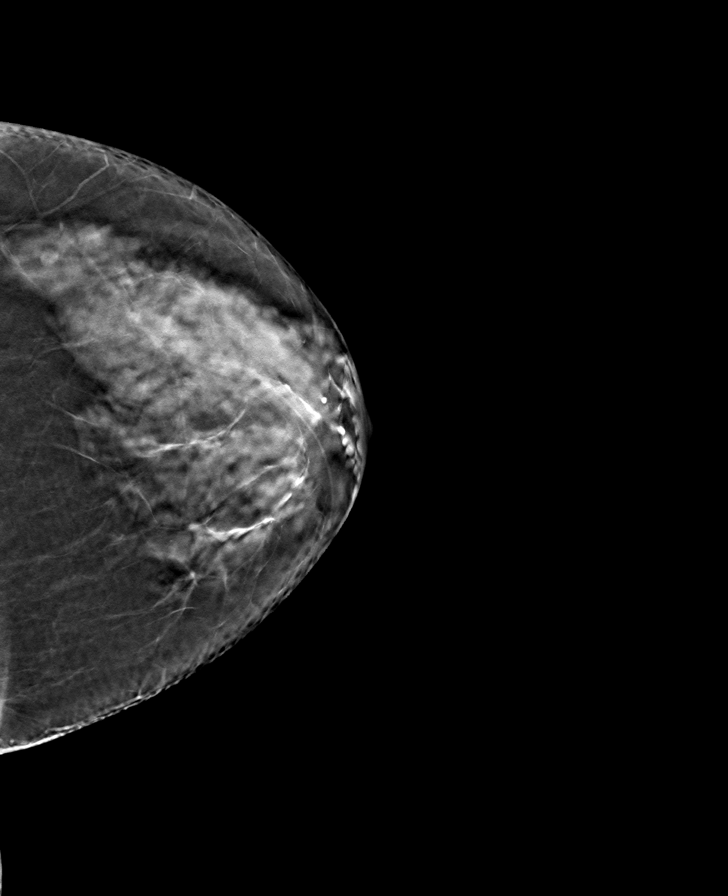

[L MLO tomo · tomo slice 33/65.0]
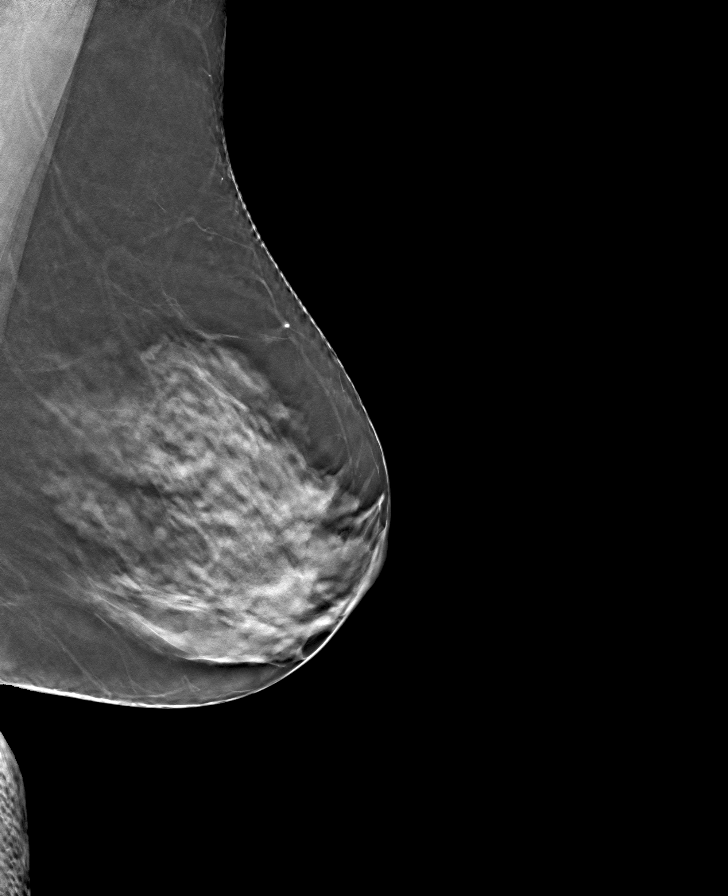

[R MLO tomo · tomo slice 33/66.0]
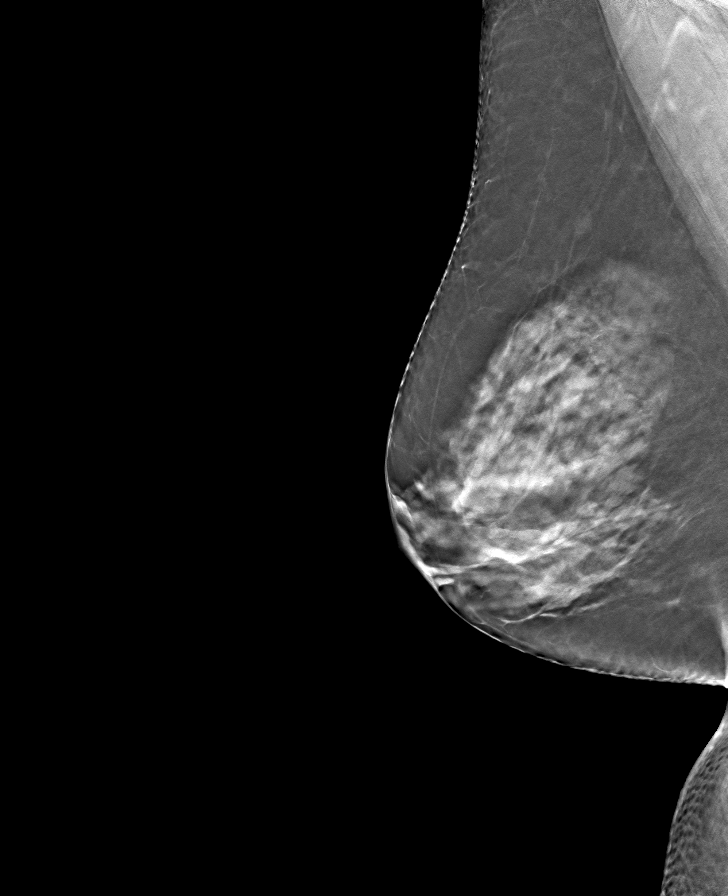

[8 of 24 positions shown; findings below may reference images not displayed]

ACR Breast Density Category d: The breast tissue is extremely dense,
which lowers the sensitivity of mammography
FINDINGS: There are no findings suspicious for malignancy. The images were
evaluated with computer-aided detection.
IMPRESSION: No mammographic evidence of malignancy. A result letter of this
screening mammogram will be mailed directly to the patient.

RECOMMENDATION:
Screening mammogram in one year. (Code:95-0-E9V)

BI-RADS CATEGORY  1: Negative.

## 2021-12-27 DIAGNOSIS — D485 Neoplasm of uncertain behavior of skin: Secondary | ICD-10-CM | POA: Diagnosis not present

## 2021-12-27 DIAGNOSIS — L989 Disorder of the skin and subcutaneous tissue, unspecified: Secondary | ICD-10-CM | POA: Diagnosis not present

## 2021-12-27 DIAGNOSIS — L821 Other seborrheic keratosis: Secondary | ICD-10-CM | POA: Diagnosis not present

## 2021-12-27 DIAGNOSIS — I781 Nevus, non-neoplastic: Secondary | ICD-10-CM | POA: Diagnosis not present

## 2021-12-27 DIAGNOSIS — L578 Other skin changes due to chronic exposure to nonionizing radiation: Secondary | ICD-10-CM | POA: Diagnosis not present

## 2022-01-21 DIAGNOSIS — D4819 Other specified neoplasm of uncertain behavior of connective and other soft tissue: Secondary | ICD-10-CM | POA: Diagnosis not present

## 2022-01-21 DIAGNOSIS — D034 Melanoma in situ of scalp and neck: Secondary | ICD-10-CM | POA: Diagnosis not present

## 2022-03-11 DIAGNOSIS — R7303 Prediabetes: Secondary | ICD-10-CM | POA: Diagnosis not present

## 2022-03-11 DIAGNOSIS — Z0000 Encounter for general adult medical examination without abnormal findings: Secondary | ICD-10-CM | POA: Diagnosis not present

## 2022-03-11 DIAGNOSIS — Z79899 Other long term (current) drug therapy: Secondary | ICD-10-CM | POA: Diagnosis not present

## 2022-03-11 DIAGNOSIS — R030 Elevated blood-pressure reading, without diagnosis of hypertension: Secondary | ICD-10-CM | POA: Diagnosis not present

## 2022-03-11 DIAGNOSIS — Z1152 Encounter for screening for COVID-19: Secondary | ICD-10-CM | POA: Diagnosis not present

## 2022-03-11 DIAGNOSIS — E7800 Pure hypercholesterolemia, unspecified: Secondary | ICD-10-CM | POA: Diagnosis not present

## 2022-03-11 DIAGNOSIS — E039 Hypothyroidism, unspecified: Secondary | ICD-10-CM | POA: Diagnosis not present

## 2022-03-11 DIAGNOSIS — Z6831 Body mass index (BMI) 31.0-31.9, adult: Secondary | ICD-10-CM | POA: Diagnosis not present

## 2022-03-24 DIAGNOSIS — Z780 Asymptomatic menopausal state: Secondary | ICD-10-CM | POA: Diagnosis not present

## 2022-05-02 DIAGNOSIS — Z85828 Personal history of other malignant neoplasm of skin: Secondary | ICD-10-CM | POA: Diagnosis not present

## 2022-05-02 DIAGNOSIS — Z85820 Personal history of malignant melanoma of skin: Secondary | ICD-10-CM | POA: Diagnosis not present

## 2022-05-02 DIAGNOSIS — L578 Other skin changes due to chronic exposure to nonionizing radiation: Secondary | ICD-10-CM | POA: Diagnosis not present

## 2022-05-02 DIAGNOSIS — Z872 Personal history of diseases of the skin and subcutaneous tissue: Secondary | ICD-10-CM | POA: Diagnosis not present

## 2022-06-17 DIAGNOSIS — E039 Hypothyroidism, unspecified: Secondary | ICD-10-CM | POA: Diagnosis not present

## 2022-06-17 DIAGNOSIS — E7800 Pure hypercholesterolemia, unspecified: Secondary | ICD-10-CM | POA: Diagnosis not present

## 2022-06-17 DIAGNOSIS — Z6832 Body mass index (BMI) 32.0-32.9, adult: Secondary | ICD-10-CM | POA: Diagnosis not present

## 2022-06-17 DIAGNOSIS — Z79899 Other long term (current) drug therapy: Secondary | ICD-10-CM | POA: Diagnosis not present

## 2022-06-17 DIAGNOSIS — R7303 Prediabetes: Secondary | ICD-10-CM | POA: Diagnosis not present

## 2022-06-17 DIAGNOSIS — Z1152 Encounter for screening for COVID-19: Secondary | ICD-10-CM | POA: Diagnosis not present

## 2022-06-17 DIAGNOSIS — R0602 Shortness of breath: Secondary | ICD-10-CM | POA: Diagnosis not present

## 2022-09-23 ENCOUNTER — Other Ambulatory Visit: Payer: Self-pay | Admitting: Family Medicine

## 2022-09-23 DIAGNOSIS — Z1231 Encounter for screening mammogram for malignant neoplasm of breast: Secondary | ICD-10-CM

## 2022-09-23 DIAGNOSIS — Z6830 Body mass index (BMI) 30.0-30.9, adult: Secondary | ICD-10-CM | POA: Diagnosis not present

## 2022-09-23 DIAGNOSIS — Z9109 Other allergy status, other than to drugs and biological substances: Secondary | ICD-10-CM | POA: Diagnosis not present

## 2022-09-23 DIAGNOSIS — Z79899 Other long term (current) drug therapy: Secondary | ICD-10-CM | POA: Diagnosis not present

## 2022-09-23 DIAGNOSIS — R7303 Prediabetes: Secondary | ICD-10-CM | POA: Diagnosis not present

## 2022-09-23 DIAGNOSIS — E7800 Pure hypercholesterolemia, unspecified: Secondary | ICD-10-CM | POA: Diagnosis not present

## 2022-09-23 DIAGNOSIS — E039 Hypothyroidism, unspecified: Secondary | ICD-10-CM | POA: Diagnosis not present

## 2022-10-13 ENCOUNTER — Ambulatory Visit
Admission: RE | Admit: 2022-10-13 | Discharge: 2022-10-13 | Disposition: A | Discharge status: Home / Self Care | Payer: Medicare Other | Source: Ambulatory Visit | Attending: Family Medicine | Admitting: Family Medicine

## 2022-10-13 DIAGNOSIS — Z1231 Encounter for screening mammogram for malignant neoplasm of breast: Secondary | ICD-10-CM | POA: Insufficient documentation

## 2022-11-16 DIAGNOSIS — L578 Other skin changes due to chronic exposure to nonionizing radiation: Secondary | ICD-10-CM | POA: Diagnosis not present

## 2022-11-16 DIAGNOSIS — Z872 Personal history of diseases of the skin and subcutaneous tissue: Secondary | ICD-10-CM | POA: Diagnosis not present

## 2022-11-16 DIAGNOSIS — Z85828 Personal history of other malignant neoplasm of skin: Secondary | ICD-10-CM | POA: Diagnosis not present

## 2022-11-16 DIAGNOSIS — L570 Actinic keratosis: Secondary | ICD-10-CM | POA: Diagnosis not present

## 2022-11-16 DIAGNOSIS — Z85820 Personal history of malignant melanoma of skin: Secondary | ICD-10-CM | POA: Diagnosis not present

## 2022-12-12 DIAGNOSIS — L918 Other hypertrophic disorders of the skin: Secondary | ICD-10-CM | POA: Diagnosis not present

## 2022-12-12 DIAGNOSIS — L720 Epidermal cyst: Secondary | ICD-10-CM | POA: Diagnosis not present

## 2023-01-13 DIAGNOSIS — Z0000 Encounter for general adult medical examination without abnormal findings: Secondary | ICD-10-CM | POA: Diagnosis not present

## 2023-01-13 DIAGNOSIS — Z6831 Body mass index (BMI) 31.0-31.9, adult: Secondary | ICD-10-CM | POA: Diagnosis not present

## 2023-01-13 DIAGNOSIS — Z79899 Other long term (current) drug therapy: Secondary | ICD-10-CM | POA: Diagnosis not present

## 2023-01-13 DIAGNOSIS — E7800 Pure hypercholesterolemia, unspecified: Secondary | ICD-10-CM | POA: Diagnosis not present

## 2023-01-13 DIAGNOSIS — E039 Hypothyroidism, unspecified: Secondary | ICD-10-CM | POA: Diagnosis not present

## 2023-01-13 DIAGNOSIS — R7303 Prediabetes: Secondary | ICD-10-CM | POA: Diagnosis not present

## 2023-01-13 DIAGNOSIS — Z9109 Other allergy status, other than to drugs and biological substances: Secondary | ICD-10-CM | POA: Diagnosis not present

## 2023-02-08 ENCOUNTER — Ambulatory Visit (HOSPITAL_BASED_OUTPATIENT_CLINIC_OR_DEPARTMENT_OTHER): Payer: Medicare Other | Admitting: Orthopaedic Surgery

## 2023-02-08 ENCOUNTER — Ambulatory Visit (HOSPITAL_BASED_OUTPATIENT_CLINIC_OR_DEPARTMENT_OTHER): Payer: Medicare Other

## 2023-02-08 ENCOUNTER — Encounter (HOSPITAL_BASED_OUTPATIENT_CLINIC_OR_DEPARTMENT_OTHER): Payer: Self-pay | Admitting: Orthopaedic Surgery

## 2023-02-08 DIAGNOSIS — M1711 Unilateral primary osteoarthritis, right knee: Secondary | ICD-10-CM | POA: Diagnosis not present

## 2023-02-08 DIAGNOSIS — M25561 Pain in right knee: Secondary | ICD-10-CM | POA: Diagnosis not present

## 2023-02-08 DIAGNOSIS — M25461 Effusion, right knee: Secondary | ICD-10-CM | POA: Diagnosis not present

## 2023-02-08 NOTE — Progress Notes (Signed)
Chief Complaint: Right knee pain     History of Present Illness:    Debbie Ortega is a 73 y.o. female presents today with right knee pain that started approximately 1 month prior.  Since that time she has had insidious onset of medial based knee pain with difficulty bearing weight.  She is taking Advil as well as using IcyHot which does help somewhat.  She feels like the knee is persistently giving out.  She has not had any injuries or falls.  She has been attempting to limit weight although this is quite difficult.  She is experiencing clicking and popping    PMH/PSH/Family History/Social History/Meds/Allergies:    Past Medical History:  Diagnosis Date   Anxiety    Cancer (HCC)    skin ca   Depression    Fracture 06/21/2015   Left   Hyperlipidemia    Joint pain    Osteoporosis    Thyroid disease    Past Surgical History:  Procedure Laterality Date   COLONOSCOPY  07/2014   3 polyps - repeat 5 yrs   ORIF ANKLE FRACTURE Left 06/25/2015   Procedure: OPEN REDUCTION INTERNAL FIXATION (ORIF) ANKLE FRACTURE;  Surgeon: Kennedy Bucker, MD;  Location: [facility] ORS;  Service: Orthopedics;  Laterality: Left;   SKIN CANCER EXCISION     nose   TUBAL LIGATION     Social History   Socioeconomic History   Marital status: Married    Spouse name: Not on file   Number of children: Not on file   Years of education: Not on file   Highest education level: Not on file  Occupational History   Not on file  Tobacco Use   Smoking status: Never   Smokeless tobacco: Never  Substance and Sexual Activity   Alcohol use: Yes    Alcohol/week: 0.0 standard drinks of alcohol   Drug use: No   Sexual activity: Never  Other Topics Concern   Not on file  Social History Narrative   Not on file   Social Drivers of Health   Financial Resource Strain: Not on file  Food Insecurity: Not on file  Transportation Needs: Not on file  Physical Activity: Not on file  Stress: Not on file  Social  Connections: Not on file   Family History  Problem Relation Age of Onset   Breast cancer Maternal Grandmother 67   No Known Allergies Current Outpatient Medications  Medication Sig Dispense Refill   baclofen (LIORESAL) 10 MG tablet Take 1 tablet (10 mg total) by mouth at bedtime as needed for muscle spasms. 30 each 0   cetirizine (ZYRTEC) 10 MG tablet Take 10 mg by mouth daily.     levothyroxine (SYNTHROID) 75 MCG tablet TAKE 1 TABLET(75 MCG) BY MOUTH DAILY 90 tablet 1   Multiple Vitamin (MULTIVITAMIN WITH MINERALS) TABS tablet Take 1 tablet by mouth daily.     sertraline (ZOLOFT) 50 MG tablet TAKE 1 TABLET(50 MG) BY MOUTH DAILY 90 tablet 2   No current facility-administered medications for this visit.   No results found.  Review of Systems:   A ROS was performed including pertinent positives and negatives as documented in the HPI.  Physical Exam :   Constitutional: NAD and appears stated age Neurological: Alert and oriented Psych: Appropriate affect and cooperative There were no vitals taken for this visit.   Comprehensive Musculoskeletal Exam:    Right knee with tenderness palpation over the medial joint line.  Positive McMurray medially.  Range  of motion is from 0 to 130 degrees.  No laxity with varus or valgus stress, negative Lachman   Imaging:   Xray (4 views right knee): Normal     I personally reviewed and interpreted the radiographs.   Assessment and Plan:   73 y.o. female with right knee pain consistent with a possible meniscal root injury.  I did describe given her constellation of inability to bear weight as well as swelling and pain in the medial based joint line I would ultimately recommend an MRI so we can rule this out.  Will plan for an MRI of the right knee and follow-up discuss results   I personally saw and evaluated the patient, and participated in the management and treatment plan.  Huel Cote, MD Attending Physician, Orthopedic  Surgery  This document was dictated using Dragon voice recognition software. A reasonable attempt at proof reading has been made to minimize errors.

## 2023-02-21 ENCOUNTER — Encounter (HOSPITAL_BASED_OUTPATIENT_CLINIC_OR_DEPARTMENT_OTHER): Payer: Self-pay | Admitting: Orthopaedic Surgery

## 2023-02-24 ENCOUNTER — Ambulatory Visit
Admission: RE | Admit: 2023-02-24 | Discharge: 2023-02-24 | Disposition: A | Discharge status: Home / Self Care | Payer: Medicare Other | Source: Ambulatory Visit | Attending: Orthopaedic Surgery | Admitting: Orthopaedic Surgery

## 2023-02-24 DIAGNOSIS — M25561 Pain in right knee: Secondary | ICD-10-CM

## 2023-02-24 DIAGNOSIS — M79661 Pain in right lower leg: Secondary | ICD-10-CM | POA: Diagnosis not present

## 2023-02-24 DIAGNOSIS — M25461 Effusion, right knee: Secondary | ICD-10-CM | POA: Diagnosis not present

## 2023-03-09 ENCOUNTER — Ambulatory Visit (HOSPITAL_BASED_OUTPATIENT_CLINIC_OR_DEPARTMENT_OTHER): Payer: Medicare Other | Admitting: Orthopaedic Surgery

## 2023-03-09 DIAGNOSIS — M25561 Pain in right knee: Secondary | ICD-10-CM

## 2023-03-09 DIAGNOSIS — M6751 Plica syndrome, right knee: Secondary | ICD-10-CM

## 2023-03-09 MED ORDER — LIDOCAINE HCL 1 % IJ SOLN
4.0000 mL | INTRAMUSCULAR | Status: AC | PRN
Start: 2023-03-09 — End: 2023-03-09
  Administered 2023-03-09: 4 mL

## 2023-03-09 MED ORDER — TRIAMCINOLONE ACETONIDE 40 MG/ML IJ SUSP
80.0000 mg | INTRAMUSCULAR | Status: AC | PRN
  Administered 2023-03-09: 80 mg via INTRA_ARTICULAR

## 2023-03-09 NOTE — Progress Notes (Signed)
 Chief Complaint: Right knee pain     History of Present Illness:   03/09/2023: Debbie Ortega today for MRI discussion of the right knee.  Debbie Ortega is a 73 y.o. female presents today with right knee pain that started approximately 1 month prior.  Since that time she has had insidious onset of medial based knee pain with difficulty bearing weight.  She is taking Advil as well as using IcyHot which does help somewhat.  She feels like the knee is persistently giving out.  She has not had any injuries or falls.  She has been attempting to limit weight although this is quite difficult.  She is experiencing clicking and popping    PMH/PSH/Family History/Social History/Meds/Allergies:    Past Medical History:  Diagnosis Date  . Anxiety   . Cancer (HCC)    skin ca  . Depression   . Fracture 06/21/2015   Left  . Hyperlipidemia   . Joint pain   . Osteoporosis   . Thyroid disease    Past Surgical History:  Procedure Laterality Date  . COLONOSCOPY  07/2014   3 polyps - repeat 5 yrs  . ORIF ANKLE FRACTURE Left 06/25/2015   Procedure: OPEN REDUCTION INTERNAL FIXATION (ORIF) ANKLE FRACTURE;  Surgeon: Kennedy Bucker, MD;  Location: [facility] OR[facility];  Service: Orthopedics;  Laterality: Left;  . SKIN CANCER EXCISION     nose  . TUBAL LIGATION     Social History   Socioeconomic History  . Marital status: Married    Spouse name: Not on file  . Number of children: Not on file  . Years of education: Not on file  . Highest education level: Not on file  Occupational History  . Not on file  Tobacco Use  . Smoking status: Never  . Smokeless tobacco: Never  Substance and Sexual Activity  . Alcohol use: Yes    Alcohol/week: 0.0 standard drinks of alcohol  . Drug use: No  . Sexual activity: Never  Other Topics Concern  . Not on file  Social History Narrative  . Not on file   Social Drivers of Health   Financial Resource Strain: Not on file  Food Insecurity: Not on file  Transportation Needs:  Not on file  Physical Activity: Not on file  Stress: Not on file  Social Connections: Not on file   Family History  Problem Relation Age of Onset  . Breast cancer Maternal Grandmother 70   No Known Allergies Current Outpatient Medications  Medication Sig Dispense Refill  . baclofen (LIORESAL) 10 MG tablet Take 1 tablet (10 mg total) by mouth at bedtime as needed for muscle spasms. 30 each 0  . cetirizine (ZYRTEC) 10 MG tablet Take 10 mg by mouth daily.    Marland Kitchen levothyroxine (SYNTHROID) 75 MCG tablet TAKE 1 TABLET(75 MCG) BY MOUTH DAILY 90 tablet 1  . Multiple Vitamin (MULTIVITAMIN WITH MINERALS) TABS tablet Take 1 tablet by mouth daily.    . sertraline (ZOLOFT) 50 MG tablet TAKE 1 TABLET(50 MG) BY MOUTH DAILY 90 tablet 2   No current facility-administered medications for this visit.   No results found.  Review of Systems:   A ROS was performed including pertinent positives and negatives as documented in the HPI.  Physical Exam :   Constitutional: NAD and appears stated age Neurological: Alert and oriented Psych: Appropriate affect and cooperative There were no vitals taken for this visit.   Comprehensive Musculoskeletal Exam:    Right knee with tenderness palpation over  the medial joint line.  Positive McMurray medially.  Range of motion is from 0 to 130 degrees.  No laxity with varus or valgus stress, negative Lachman   Imaging:   Xray (4 views right knee): Normal   MRI right knee: Significant knee effusion with a large plica and fat pad hypertrophy  I personally reviewed and interpreted the radiographs.   Assessment and Plan:   73 y.o. 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  At today's visit I did recommend ultrasound-guided injection of the knee.  She has had this on the left which gave her nearly complete relief nightly.  I do believe that this would be similar on the right.  -Right knee ultrasound-guided  injection provided after verbal consent obtained    Procedure Note  Patient: Debbie Ortega             Date of Birth: 06/18/1950           MRN: [id number]             Visit Date: 03/09/2023  Procedures: Visit Diagnoses: No diagnosis found.  Large Joint Inj: R knee on 03/09/2023 12:22 PM Indications: pain Details: 22 G 1.5 in needle, ultrasound-guided anterior approach  Arthrogram: No  Medications: 4 mL lidocaine 1 %; 80 mg triamcinolone acetonide 40 MG/ML Outcome: tolerated well, no immediate complications Procedure, treatment alternatives, risks and benefits explained, specific risks discussed. Consent was given by the patient. Immediately prior to procedure a time out was called to verify the correct patient, procedure, equipment, support staff and site/side marked as required. Patient was prepped and draped in the usual sterile fashion.        I personally saw and evaluated the patient, and participated in the management and treatment plan.  Huel Cote, MD Attending Physician, Orthopedic Surgery  This document was dictated using Dragon voice recognition software. A reasonable attempt at proof reading has been made to minimize errors.

## 2023-04-11 ENCOUNTER — Encounter: Payer: Self-pay | Admitting: Urgent Care

## 2023-04-11 ENCOUNTER — Ambulatory Visit: Admitting: Urgent Care

## 2023-04-11 VITALS — BP 136/81 | HR 80 | Ht 62.0 in | Wt 170.1 lb

## 2023-04-11 DIAGNOSIS — M79661 Pain in right lower leg: Secondary | ICD-10-CM

## 2023-04-11 DIAGNOSIS — E039 Hypothyroidism, unspecified: Secondary | ICD-10-CM | POA: Diagnosis not present

## 2023-04-11 DIAGNOSIS — M7918 Myalgia, other site: Secondary | ICD-10-CM | POA: Diagnosis not present

## 2023-04-11 LAB — COMPREHENSIVE METABOLIC PANEL WITH GFR
ALT: 18 U/L (ref 0–35)
AST: 19 U/L (ref 0–37)
Albumin: 4.8 g/dL (ref 3.5–5.2)
Alkaline Phosphatase: 69 U/L (ref 39–117)
BUN: 16 mg/dL (ref 6–23)
CO2: 31 meq/L (ref 19–32)
Calcium: 9.6 mg/dL (ref 8.4–10.5)
Chloride: 99 meq/L (ref 96–112)
Creatinine, Ser: 0.82 mg/dL (ref 0.40–1.20)
GFR: 70.33 mL/min (ref >60.00)
Glucose, Bld: 90 mg/dL (ref 70–99)
Potassium: 4.4 meq/L (ref 3.5–5.1)
Sodium: 139 meq/L (ref 135–145)
Total Bilirubin: 0.5 mg/dL (ref 0.2–1.2)
Total Protein: 7.2 g/dL (ref 6.0–8.3)

## 2023-04-11 LAB — CBC WITH DIFFERENTIAL/PLATELET
Basophils Absolute: 0.1 10*3/uL (ref 0.0–0.1)
Basophils Relative: 0.8 % (ref 0.0–3.0)
Eosinophils Absolute: 0.1 10*3/uL (ref 0.0–0.7)
Eosinophils Relative: 1.3 % (ref 0.0–5.0)
HCT: 44.3 % (ref 36.0–46.0)
Hemoglobin: 14.8 g/dL (ref 12.0–15.0)
Lymphocytes Relative: 20.9 % (ref 12.0–46.0)
Lymphs Abs: 1.6 10*3/uL (ref 0.7–4.0)
MCHC: 33.3 g/dL (ref 30.0–36.0)
MCV: 93.3 fl (ref 78.0–100.0)
Monocytes Absolute: 0.7 10*3/uL (ref 0.1–1.0)
Monocytes Relative: 9.2 % (ref 3.0–12.0)
Neutro Abs: 5.2 10*3/uL (ref 1.4–7.7)
Neutrophils Relative %: 67.8 % (ref 43.0–77.0)
Platelets: 264 10*3/uL (ref 150.0–400.0)
RBC: 4.75 Mil/uL (ref 3.87–5.11)
RDW: 14.9 % (ref 11.5–15.5)
WBC: 7.6 10*3/uL (ref 4.0–10.5)

## 2023-04-11 LAB — TSH: TSH: 1.61 u[IU]/mL (ref 0.35–5.50)

## 2023-04-11 LAB — MAGNESIUM: Magnesium: 2.2 mg/dL (ref 1.5–2.5)

## 2023-04-11 LAB — CK: Total CK: 64 U/L (ref 7–177)

## 2023-04-11 LAB — SEDIMENTATION RATE: Sed Rate: 8 mm/h (ref 0–30)

## 2023-04-11 LAB — C-REACTIVE PROTEIN: CRP: <1 mg/dL (ref 0.5–20.0)

## 2023-04-11 LAB — VITAMIN D 25 HYDROXY (VIT D DEFICIENCY, FRACTURES): VITD: 60.63 ng/mL (ref 30.00–100.00)

## 2023-04-11 NOTE — Patient Instructions (Signed)
 Please stop your crestor for one week to see if this helps resolve your calf pain.  I have ordered labs and an ultrasound of your right lower leg to further evaluate the cause of this pain.  Please schedule a 1-2 week follow up to reassess. Make sure you are drinking plenty of water.

## 2023-04-11 NOTE — Progress Notes (Unsigned)
 New Patient Office Visit  Subjective:  Patient ID: Debbie Ortega, female    DOB: 11-Mar-1950  Age: 73 y.o. MRN: [id number]  CC:  Chief Complaint  Patient presents with  . Acute Visit    New pt. Pt is not sure if she will est care just yet. Pt currently goes to [facility]. She has been having right calf pain for about 3 months now.    HPI Debbie Ortega presents due to right calf pain.  ***  Outpatient Encounter Medications as of 04/11/2023  Medication Sig  . cetirizine (ZYRTEC) 10 MG tablet Take 10 mg by mouth daily.  Marland Kitchen levothyroxine (SYNTHROID) 88 MCG tablet Take 88 mcg by mouth daily before breakfast.  . Magnesium Oxide -Mg Supplement 250 MG TABS Take 1 tablet by mouth daily.  . Multiple Vitamin (MULTIVITAMIN WITH MINERALS) TABS tablet Take 1 tablet by mouth daily.  . sertraline (ZOLOFT) 50 MG tablet TAKE 1 TABLET(50 MG) BY MOUTH DAILY  . [DISCONTINUED] rosuvastatin (CRESTOR) 10 MG tablet Take 10 mg by mouth at bedtime.  . [DISCONTINUED] baclofen (LIORESAL) 10 MG tablet Take 1 tablet (10 mg total) by mouth at bedtime as needed for muscle spasms.  . [DISCONTINUED] levothyroxine (SYNTHROID) 75 MCG tablet TAKE 1 TABLET(75 MCG) BY MOUTH DAILY (Patient not taking: Reported on 04/11/2023)   No facility-administered encounter medications on file as of 04/11/2023.    Past Medical History:  Diagnosis Date  . Anxiety   . Cancer (HCC) 2024   skin ca  . Cataract 2021  . Depression 2012  . Fracture 06/21/2015   Left  . Hyperlipidemia   . Joint pain   . Osteoporosis   . Thyroid disease     Past Surgical History:  Procedure Laterality Date  . COLONOSCOPY  07/2014   3 polyps - repeat 5 yrs  . FRACTURE SURGERY  2021  . ORIF ANKLE FRACTURE Left 06/25/2015   Procedure: OPEN REDUCTION INTERNAL FIXATION (ORIF) ANKLE FRACTURE;  Surgeon: Kennedy Bucker, MD;  Location: [facility] ORS;  Service: Orthopedics;  Laterality: Left;  . SKIN CANCER EXCISION     nose  . TUBAL LIGATION  1985     Family History  Problem Relation Age of Onset  . Breast cancer Maternal Grandmother 30    Social History   Socioeconomic History  . Marital status: Married    Spouse name: Not on file  . Number of children: Not on file  . Years of education: Not on file  . Highest education level: Bachelor's degree (e.g., BA, AB, BS)  Occupational History  . Not on file  Tobacco Use  . Smoking status: Never  . Smokeless tobacco: Never  Substance and Sexual Activity  . Alcohol use: Yes    Alcohol/week: 2.0 standard drinks of alcohol    Types: 2 Cans of beer per week  . Drug use: No  . Sexual activity: Yes    Birth control/protection: Surgical  Other Topics Concern  . Not on file  Social History Narrative  . Not on file   Social Drivers of Health   Financial Resource Strain: Low Risk  (04/07/2023)   Overall Financial Resource Strain (CARDIA)   . Difficulty of Paying Living Expenses: Not very hard  Food Insecurity: No Food Insecurity (04/07/2023)   Hunger Vital Sign   . Worried About Programme researcher, broadcasting/film/video in the Last Year: Never true   . Ran Out of Food in the Last Year: Never true  Transportation Needs: No Transportation Needs (  04/07/2023)   PRAPARE - Transportation   . Lack of Transportation (Medical): No   . Lack of Transportation (Non-Medical): No  Physical Activity: Insufficiently Active (04/07/2023)   Exercise Vital Sign   . Days of Exercise per Week: 3 days   . Minutes of Exercise per Session: 20 min  Stress: No Stress Concern Present (04/07/2023)   [employer] of Occupational Health - Occupational Stress Questionnaire   . Feeling of Stress : Not at all  Social Connections: Socially Integrated (04/07/2023)   Social Connection and Isolation Panel [NHANES]   . Frequency of Communication with Friends and Family: More than three times a week   . Frequency of Social Gatherings with Friends and Family: More than three times a week   . Attends Religious Services: More than 4  times per year   . Active Member of Clubs or Organizations: Yes   . Attends Banker Meetings: More than 4 times per year   . Marital Status: Married  Catering manager Violence: Not on file    ROS: as noted in HPI  Objective:  BP 136/81   Pulse 80   Ht 5\' 2"  (1.575 m)   Wt 170 lb 1.9 oz (77.2 kg)   SpO2 96%   BMI 31.12 kg/m   Physical Exam  {Labs (Optional):23779}  Assessment & Plan:  There are no diagnoses linked to this encounter.  No follow-ups on file.   Debbie Bees, PA

## 2023-04-12 ENCOUNTER — Encounter: Payer: Self-pay | Admitting: Urgent Care

## 2023-04-14 ENCOUNTER — Encounter (HOSPITAL_COMMUNITY)

## 2023-04-17 ENCOUNTER — Ambulatory Visit (HOSPITAL_COMMUNITY)
Admission: RE | Admit: 2023-04-17 | Discharge: 2023-04-17 | Disposition: A | Discharge status: Home / Self Care | Source: Ambulatory Visit | Attending: Urgent Care | Admitting: Urgent Care

## 2023-04-17 ENCOUNTER — Encounter: Payer: Self-pay | Admitting: Urgent Care

## 2023-04-17 DIAGNOSIS — M79661 Pain in right lower leg: Secondary | ICD-10-CM | POA: Diagnosis not present

## 2023-04-17 DIAGNOSIS — M7918 Myalgia, other site: Secondary | ICD-10-CM | POA: Diagnosis not present

## 2023-04-25 ENCOUNTER — Encounter: Payer: Self-pay | Admitting: Urgent Care

## 2023-04-25 ENCOUNTER — Ambulatory Visit (INDEPENDENT_AMBULATORY_CARE_PROVIDER_SITE_OTHER): Admitting: Urgent Care

## 2023-04-25 VITALS — BP 134/83 | HR 79 | Wt 172.8 lb

## 2023-04-25 DIAGNOSIS — E782 Mixed hyperlipidemia: Secondary | ICD-10-CM | POA: Diagnosis not present

## 2023-04-25 DIAGNOSIS — M79661 Pain in right lower leg: Secondary | ICD-10-CM

## 2023-04-25 DIAGNOSIS — M7918 Myalgia, other site: Secondary | ICD-10-CM

## 2023-04-25 DIAGNOSIS — E039 Hypothyroidism, unspecified: Secondary | ICD-10-CM

## 2023-04-25 NOTE — Patient Instructions (Signed)
 Return for annual PE in three months. Will recheck fasting lipid panel at that time. Consider initiation of Repatha or Zetia.  Continue levothyroxine and and zoloft as previously prescribed.

## 2023-04-25 NOTE — Progress Notes (Signed)
 Established Patient Office Visit  Subjective:  Patient ID: Debbie Ortega, female    DOB: 07/20/1950  Age: 73 y.o. MRN: [id number]  Chief Complaint  Patient presents with   Follow-up    2 week follow up. Pt did bring a copy of her previous labs.    HPI  Discussed the use of AI scribe software for clinical note transcription with the patient, who gave verbal consent to proceed.  History of Present Illness   Debbie Ortega is a 73 year old female who presents for follow up on muscle cramps in the right calf.  She experienced significant improvement in her symptoms after discontinuing rosuvastatin, which she had been taking for two to three years. Initially, she had unbearable pain in her right calf, which has since resolved, leaving only mild fatigue after activities such as shopping.  A comprehensive workup was conducted, including eight different labs and an ultrasound. The labs, including CK levels, thyroid function, inflammatory markers, CMP, CBC, vitamin D, and magnesium, were all normal. The ultrasound showed no evidence of deep vein thrombosis or cystic structures in the popliteal fossa.  She has a history of high LDL cholesterol, which was previously managed with rosuvastatin. She has been on various statins in the past, including atorvastatin, but was taken off them due to side effects. She is currently not on any cholesterol-lowering medication. ASCVD risk score estimated at 16.3%. Pt is hesitant to restart medications for cholesterol. No hx of CVD, CAD, smoking or thrombosis.  She is currently taking levothyroxine and Zoloft. She also takes magnesium oxide once daily.  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 She has been seen by various doctors, including a geriatric doctor who reduced her medication load significantly.  No smoking, history of blood clots, stents,  heart attacks, or diabetes.      Patient Active Problem List   Diagnosis Date Noted   Arthralgia of right temporomandibular joint 02/11/2019   Allergic rhinitis 12/28/2016   Hx of basal cell carcinoma 12/24/2015   S/P ORIF (open reduction internal fixation) fracture 06/25/2015   Left knee pain 12/15/2014   Osteoporosis 12/15/2014   Acquired hypothyroidism 12/15/2014   Depression, major, single episode, complete remission (HCC) 12/15/2014   Hyperlipidemia 12/15/2014   Hx of colonic polyps 12/15/2014   FH: lung cancer 12/15/2014   FH: Parkinson's disease 12/15/2014   FH: leukemia 12/15/2014   Obesity, Class I, BMI 30-34.9 12/15/2014   Vitamin D deficiency 12/15/2014   Past Medical History:  Diagnosis Date   Anxiety    Cancer (HCC) 2024   skin ca   Cataract 2021   Depression 2012   Fracture 06/21/2015   Left   Hyperlipidemia    Joint pain    Osteoporosis    Thyroid disease    Past Surgical History:  Procedure Laterality Date   COLONOSCOPY  07/2014   3 polyps - repeat 5 yrs   FRACTURE SURGERY  2021   ORIF ANKLE FRACTURE Left 06/25/2015   Procedure: OPEN REDUCTION INTERNAL FIXATION (ORIF) ANKLE FRACTURE;  Surgeon: Kennedy Bucker, MD;  Location: [facility] ORS;  Service: Orthopedics;  Laterality: Left;   SKIN CANCER EXCISION     nose   TUBAL LIGATION  1985   Social History   Tobacco Use   Smoking status: Never   Smokeless tobacco: Never  Substance Use Topics   Alcohol use: Yes    Alcohol/week:  2.0 standard drinks of alcohol    Types: 2 Cans of beer per week   Drug use: No      ROS: as noted in HPI  Objective:     BP 134/83   Pulse 79   Wt 172 lb 12.8 oz (78.4 kg)   SpO2 96%   BMI 31.61 kg/m  BP Readings from Last 3 Encounters:  04/25/23 134/83  04/11/23 136/81  02/11/19 118/64   Wt Readings from Last 3 Encounters:  04/25/23 172 lb 12.8 oz (78.4 kg)  04/11/23 170 lb 1.9 oz (77.2 kg)  02/11/19 174 lb (78.9 kg)      Physical Exam Vitals and nursing  note reviewed.  Constitutional:      General: She is not in acute distress.    Appearance: Normal appearance. She is not ill-appearing, toxic-appearing or diaphoretic.  HENT:     Head: Normocephalic and atraumatic.  Eyes:     General: No scleral icterus.       Right eye: No discharge.        Left eye: No discharge.     Extraocular Movements: Extraocular movements intact.     Pupils: Pupils are equal, round, and reactive to light.  Cardiovascular:     Rate and Rhythm: Normal rate.  Pulmonary:     Effort: Pulmonary effort is normal. No respiratory distress.  Skin:    General: Skin is warm and dry.     Coloration: Skin is not jaundiced.     Findings: No bruising, erythema or rash.  Neurological:     General: No focal deficit present.     Mental Status: She is alert and oriented to person, place, and time.     Gait: Gait normal.  Psychiatric:        Mood and Affect: Mood normal.        Behavior: Behavior normal.      No results found for any visits on 04/25/23.  Last CBC Lab Results  Component Value Date   WBC 7.6 04/11/2023   HGB 14.8 04/11/2023   HCT 44.3 04/11/2023   MCV 93.3 04/11/2023   MCH 30.3 02/11/2019   RDW 14.9 04/11/2023   PLT 264.0 04/11/2023   Last metabolic panel Lab Results  Component Value Date   GLUCOSE 90 04/11/2023   NA 139 04/11/2023   K 4.4 04/11/2023   CL 99 04/11/2023   CO2 31 04/11/2023   BUN 16 04/11/2023   CREATININE 0.82 04/11/2023   GFR 70.33 04/11/2023   CALCIUM 9.6 04/11/2023   PROT 7.2 04/11/2023   ALBUMIN 4.8 04/11/2023   BILITOT 0.5 04/11/2023   ALKPHOS 69 04/11/2023   AST 19 04/11/2023   ALT 18 04/11/2023   ANIONGAP 12 02/11/2019   Last lipids Lab Results  Component Value Date   CHOL 253 (H) 02/11/2019   HDL 47 02/11/2019   LDLCALC 178 (H) 02/11/2019   TRIG 140 02/11/2019   CHOLHDL 5.4 02/11/2019   Last hemoglobin A1c No results found for: "HGBA1C"    The ASCVD Risk score (Arnett DK, et al., 2019) failed to  calculate for the following reasons:   Cannot find a previous HDL lab   Cannot find a previous total cholesterol lab  ASCVD 16.3%.  Assessment & Plan:  Right calf pain  Myalgia, lower leg  Acquired hypothyroidism  Mixed hyperlipidemia  Assessment and Plan    Muscle cramps Cramps in right calf improved after stopping rosuvastatin. Labs and ultrasound ruled out other causes. Likely  due to rosuvastatin. - Continue to avoid rosuvastatin.  Hyperlipidemia Elevated LDL cholesterol. ASCVD risk 16.3%. Previous rosuvastatin not tolerated. Discussed Repatha and Zetia as options. - Repeat lipid panel at annual physical. - Discuss potential treatment options, including Repatha and Zetia, after lipid panel results.  General Health Maintenance Taking levothyroxine and sertraline. No antihypertensives or history of smoking, blood clots, stents, or MI. Considering magnesium glycinate for additional benefits. - Switch to magnesium glycinate for potential additional health benefits. - Schedule annual physical exam and fasting labs in approximately three months.         Return in about 3 months (around 07/25/2023) for Annual Physical.   Mandy Second, PA

## 2023-05-22 DIAGNOSIS — Z85828 Personal history of other malignant neoplasm of skin: Secondary | ICD-10-CM | POA: Diagnosis not present

## 2023-05-22 DIAGNOSIS — Z872 Personal history of diseases of the skin and subcutaneous tissue: Secondary | ICD-10-CM | POA: Diagnosis not present

## 2023-05-22 DIAGNOSIS — L578 Other skin changes due to chronic exposure to nonionizing radiation: Secondary | ICD-10-CM | POA: Diagnosis not present

## 2023-05-22 DIAGNOSIS — B078 Other viral warts: Secondary | ICD-10-CM | POA: Diagnosis not present

## 2023-05-22 DIAGNOSIS — Z85820 Personal history of malignant melanoma of skin: Secondary | ICD-10-CM | POA: Diagnosis not present

## 2023-05-23 DIAGNOSIS — H524 Presbyopia: Secondary | ICD-10-CM | POA: Diagnosis not present

## 2023-07-25 ENCOUNTER — Encounter: Payer: Self-pay | Admitting: Urgent Care

## 2023-07-25 ENCOUNTER — Ambulatory Visit (INDEPENDENT_AMBULATORY_CARE_PROVIDER_SITE_OTHER): Admitting: Urgent Care

## 2023-07-25 ENCOUNTER — Encounter: Admitting: Urgent Care

## 2023-07-25 VITALS — BP 131/77 | HR 84 | Resp 18 | Ht 62.0 in | Wt 176.5 lb

## 2023-07-25 DIAGNOSIS — Z1211 Encounter for screening for malignant neoplasm of colon: Secondary | ICD-10-CM | POA: Diagnosis not present

## 2023-07-25 DIAGNOSIS — M8589 Other specified disorders of bone density and structure, multiple sites: Secondary | ICD-10-CM

## 2023-07-25 DIAGNOSIS — E782 Mixed hyperlipidemia: Secondary | ICD-10-CM

## 2023-07-25 DIAGNOSIS — Z860100 Personal history of colon polyps, unspecified: Secondary | ICD-10-CM

## 2023-07-25 DIAGNOSIS — Z1231 Encounter for screening mammogram for malignant neoplasm of breast: Secondary | ICD-10-CM

## 2023-07-25 DIAGNOSIS — Z0000 Encounter for general adult medical examination without abnormal findings: Secondary | ICD-10-CM

## 2023-07-25 DIAGNOSIS — E039 Hypothyroidism, unspecified: Secondary | ICD-10-CM

## 2023-07-25 DIAGNOSIS — Z23 Encounter for immunization: Secondary | ICD-10-CM

## 2023-07-25 NOTE — Progress Notes (Unsigned)
 Complete physical exam  Patient: Debbie Ortega   DOB: 23-Mar-1950   73 y.o. Female  MRN: [id number]  Subjective:    Chief Complaint  Patient presents with   Annual Exam    Debbie Ortega is a 73 y.o. female who presents today for a complete physical exam. She reports consuming a low carb diet. Home exercise routine includes walking the dog 10-15 minutes daily. She generally feels well. She reports sleeping fairly well. She does not have additional problems to discuss today.    Most recent fall risk assessment:    02/11/2019   10:11 AM  Fall Risk   Falls in the past year? 0   Number falls in past yr: 0  Injury with Fall? 0  Follow up Falls evaluation completed      Data saved with a previous flowsheet row definition     Most recent depression screenings:    02/11/2019   10:11 AM 12/28/2017    9:57 AM  PHQ 2/9 Scores  PHQ - 2 Score 0 0  PHQ- 9 Score 0     Vision:Within last year and Dental: No current dental problems and Receives regular dental care  Patient Active Problem List   Diagnosis Date Noted   Arthralgia of right temporomandibular joint 02/11/2019   Allergic rhinitis 12/28/2016   Hx of basal cell carcinoma 12/24/2015   S/P ORIF (open reduction internal fixation) fracture 06/25/2015   Left knee pain 12/15/2014   Osteoporosis 12/15/2014   Acquired hypothyroidism 12/15/2014   Depression, major, single episode, complete remission (HCC) 12/15/2014   Hyperlipidemia 12/15/2014   Hx of colonic polyps 12/15/2014   FH: lung cancer 12/15/2014   FH: Parkinson's disease 12/15/2014   FH: leukemia 12/15/2014   Obesity, Class I, BMI 30-34.9 12/15/2014   Vitamin D  deficiency 12/15/2014   Past Medical History:  Diagnosis Date   Anxiety    Cancer (HCC) 2024   skin ca   Cataract 2021   Depression 2012   Fracture 06/21/2015   Left   Hyperlipidemia    Joint pain    Osteoporosis    Thyroid  disease    Past Surgical History:  Procedure Laterality Date   COLONOSCOPY   07/2014   3 polyps - repeat 5 yrs   FRACTURE SURGERY  2021   ORIF ANKLE FRACTURE Left 06/25/2015   Procedure: OPEN REDUCTION INTERNAL FIXATION (ORIF) ANKLE FRACTURE;  Surgeon: Ozell Flake, MD;  Location: [facility] ORS;  Service: Orthopedics;  Laterality: Left;   SKIN CANCER EXCISION     nose   TUBAL LIGATION  1985   Social History   Tobacco Use   Smoking status: Never   Smokeless tobacco: Never  Substance Use Topics   Alcohol use: Yes    Alcohol/week: 2.0 standard drinks of alcohol    Types: 2 Cans of beer per week   Drug use: No    Patient Care Team: Lowella Benton CROME, [place] as PCP - General (Physician Assistant)   Outpatient Medications Prior to Visit  Medication Sig   Calcium Carb-Cholecalciferol  (CALCIUM+D3) 600-20 MG-MCG TABS Take 1 tablet by mouth daily.   cetirizine (ZYRTEC) 10 MG tablet Take 10 mg by mouth daily.   Magnesium  Oxide -Mg Supplement 250 MG TABS Take 1 tablet by mouth daily.   Multiple Vitamins-Minerals (CENTRUM SILVER 50+WOMEN PO) Take 1 tablet by mouth daily.   levothyroxine  (SYNTHROID ) 88 MCG tablet Take 88 mcg by mouth daily before breakfast.   sertraline  (ZOLOFT ) 50 MG tablet TAKE 1 TABLET(50  MG) BY MOUTH DAILY   No facility-administered medications prior to visit.    ROS Complete 12 point ROS performed with all pertinent positives listed in HPI      Objective:     BP 131/77 (BP Location: Left Arm, Patient Position: Sitting, Cuff Size: Large)   Pulse 84   Resp 18   Ht 5' 2 (1.575 m)   Wt 176 lb 8 oz (80.1 kg)   SpO2 98%   BMI 32.28 kg/m  BP Readings from Last 3 Encounters:  07/25/23 131/77  04/25/23 134/83  04/11/23 136/81   Wt Readings from Last 3 Encounters:  07/25/23 176 lb 8 oz (80.1 kg)  04/25/23 172 lb 12.8 oz (78.4 kg)  04/11/23 170 lb 1.9 oz (77.2 kg)      Physical Exam Vitals and nursing note reviewed.  Constitutional:      General: She is not in acute distress.    Appearance: Normal appearance. She is not ill-appearing,  toxic-appearing or diaphoretic.  HENT:     Head: Normocephalic and atraumatic.     Right Ear: Tympanic membrane, ear canal and external ear normal. There is no impacted cerumen.     Left Ear: Tympanic membrane, ear canal and external ear normal. There is no impacted cerumen.     Nose: Nose normal.     Mouth/Throat:     Mouth: Mucous membranes are moist.     Pharynx: Oropharynx is clear. No oropharyngeal exudate or posterior oropharyngeal erythema.  Eyes:     General: No scleral icterus.       Right eye: No discharge.        Left eye: No discharge.     Extraocular Movements: Extraocular movements intact.     Pupils: Pupils are equal, round, and reactive to light.  Neck:     Thyroid : No thyroid  mass, thyromegaly or thyroid  tenderness.  Cardiovascular:     Rate and Rhythm: Normal rate and regular rhythm.     Pulses: Normal pulses.     Heart sounds: No murmur heard. Pulmonary:     Effort: Pulmonary effort is normal. No respiratory distress.     Breath sounds: Normal breath sounds. No stridor. No wheezing or rhonchi.  Abdominal:     General: Abdomen is flat. Bowel sounds are normal. There is no distension.     Palpations: Abdomen is soft. There is no mass.     Tenderness: There is no abdominal tenderness. There is no guarding.  Musculoskeletal:     Cervical back: Normal range of motion and neck supple. No rigidity or tenderness.     Right lower leg: No edema.     Left lower leg: No edema.  Lymphadenopathy:     Cervical: No cervical adenopathy.  Skin:    General: Skin is warm and dry.     Coloration: Skin is not jaundiced.     Findings: No bruising, erythema or rash.  Neurological:     General: No focal deficit present.     Mental Status: She is alert and oriented to person, place, and time.     Sensory: No sensory deficit.     Motor: No weakness.  Psychiatric:        Mood and Affect: Mood normal.        Behavior: Behavior normal.       No results found for any visits on  07/25/23. Last CBC Lab Results  Component Value Date   WBC 7.6 04/11/2023   HGB 14.8 04/11/2023  HCT 44.3 04/11/2023   MCV 93.3 04/11/2023   MCH 30.3 02/11/2019   RDW 14.9 04/11/2023   PLT 264.0 04/11/2023   Last metabolic panel Lab Results  Component Value Date   GLUCOSE 90 04/11/2023   NA 139 04/11/2023   K 4.4 04/11/2023   CL 99 04/11/2023   CO2 31 04/11/2023   BUN 16 04/11/2023   CREATININE 0.82 04/11/2023   GFR 70.33 04/11/2023   CALCIUM 9.6 04/11/2023   PROT 7.2 04/11/2023   ALBUMIN 4.8 04/11/2023   BILITOT 0.5 04/11/2023   ALKPHOS 69 04/11/2023   AST 19 04/11/2023   ALT 18 04/11/2023   ANIONGAP 12 02/11/2019   Last lipids Lab Results  Component Value Date   CHOL 253 (H) 02/11/2019   HDL 47 02/11/2019   LDLCALC 178 (H) 02/11/2019   TRIG 140 02/11/2019   CHOLHDL 5.4 02/11/2019   Last hemoglobin A1c No results found for: HGBA1C Last thyroid  functions Lab Results  Component Value Date   TSH 1.61 04/11/2023   Last vitamin D  Lab Results  Component Value Date   VD25OH 60.63 04/11/2023   Last vitamin B12 and Folate No results found for: VITAMINB12, FOLATE      Assessment & Plan:    Routine Health Maintenance and Physical Exam  Immunization History  Administered Date(s) Administered   DTaP 12/14/2005   Fluad Quad(high Dose 65+) 10/14/2018   Influenza, High Dose Seasonal PF 10/14/2016, 10/17/2017   Influenza-Unspecified 10/24/2014   Moderna Sars-Covid-2 Vaccination 02/21/2019, 03/21/2019   Pneumococcal Conjugate-13 10/24/2014   Pneumococcal Polysaccharide-23 12/24/2015   Tdap 09/02/2015   Zoster, Live 12/15/2014    Health Maintenance  Topic Date Due   Medicare Annual Wellness (AWV)  Never done   Zoster Vaccines- Shingrix  (1 of 2) 08/02/1967   Colonoscopy  07/26/2019   COVID-19 Vaccine (3 - Moderna risk series) 04/23/2024 (Originally 04/18/2019)   INFLUENZA VACCINE  08/11/2023   MAMMOGRAM  10/13/2023   DTaP/Tdap/Td (3 - Td or Tdap)  09/01/2025   Pneumococcal Vaccine: 50+ Years  Completed   DEXA SCAN  Completed   Hepatitis C Screening  Completed   Hepatitis B Vaccines  Aged Out   HPV VACCINES  Aged Out   Meningococcal B Vaccine  Aged Out    Discussed health benefits of physical activity, and encouraged her to engage in regular exercise appropriate for her age and condition.  Problem List Items Addressed This Visit     Acquired hypothyroidism   Relevant Orders   TSH   T4, free   Hyperlipidemia   Relevant Orders   Lipid panel   Other Visit Diagnoses       Wellness examination    -  Primary   Relevant Orders   CBC with Differential/Platelet   Hemoglobin A1c   TSH   Lipid panel   Comprehensive metabolic panel with GFR     Osteopenia of multiple sites       Relevant Orders   DG Bone Density     History of colon polyps       Relevant Orders   Ambulatory referral to Gastroenterology     Screen for colon cancer       Relevant Orders   Ambulatory referral to Gastroenterology     Need for shingles vaccine         Encounter for screening mammogram for breast cancer       Relevant Orders   MM 3D SCREENING MAMMOGRAM BILATERAL BREAST  Return in about 1 year (around 07/24/2024) for Annual Physical.  Annual PE and fasting labs obtained today. BP within goal range. 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  Pt is due for DEXA - last one in 2021 showing osteopenia Mammogram due in October. Pt is due for colonoscopy, referral placed.    Benton LITTIE Gave, PA

## 2023-07-25 NOTE — Patient Instructions (Addendum)
 Please continue all medications as ordered. Labs were updated today.  Please complete your mammogram screening mid to late October. Please update your colonoscopy and DEXA scan  Please update your shingles vaccine at the pharmacy. Then repeat it again in 2-6 months.  Return to see me annually, sooner as needed.

## 2023-07-26 LAB — T4, FREE: Free T4: 1.64 ng/dL (ref 0.82–1.77)

## 2023-09-25 ENCOUNTER — Other Ambulatory Visit: Payer: Self-pay | Admitting: Urgent Care

## 2023-09-25 DIAGNOSIS — F325 Major depressive disorder, single episode, in full remission: Secondary | ICD-10-CM

## 2023-09-25 NOTE — Telephone Encounter (Unsigned)
 Copied from CRM #[id number]. Topic: Clinical - Medication Refill >> Sep 25, 2023 12:42 PM Fredrica W wrote: Medication:  levothyroxine  (SYNTHROID ) 88 MCG tablet sertraline  (ZOLOFT ) 50 MG tablet  Has the patient contacted their pharmacy? Yes (Agent: If no, request that the patient contact the pharmacy for the refill. If patient does not wish to contact the pharmacy document the reason why and proceed with request.) (Agent: If yes, when and what did the pharmacy advise?) previous provider   This is the patient's preferred pharmacy:  [id number] [place], [place] - [address] AT [facility] OF [facility] & [address] [facility] [place] [address] Phone: [phone] Fax: [phone]  Is this the correct pharmacy for this prescription? Yes If no, delete pharmacy and type the correct one.   Has the prescription been filled recently? No  Is the patient out of the medication? No  Has the patient been seen for an appointment in the last year OR does the patient have an upcoming appointment? Yes  Can we respond through MyChart? No  Agent: Please be advised that Rx refills may take up to 3 business days. We ask that you follow-up with your pharmacy.

## 2023-09-26 ENCOUNTER — Other Ambulatory Visit: Payer: Self-pay

## 2023-09-26 MED ORDER — SERTRALINE HCL 50 MG PO TABS
50.0000 mg | ORAL_TABLET | Freq: Every day | ORAL | 2 refills | Status: DC
Start: 2023-09-26 — End: 2023-09-26

## 2023-09-26 MED ORDER — LEVOTHYROXINE SODIUM 88 MCG PO TABS: 88.0000 ug | ORAL_TABLET | Freq: Every day | ORAL | 0 refills | Status: DC

## 2023-09-26 MED ORDER — SERTRALINE HCL 50 MG PO TABS: 50.0000 mg | ORAL_TABLET | Freq: Every day | ORAL | 2 refills | Status: DC

## 2023-09-26 NOTE — Telephone Encounter (Signed)
 I refilled the thyroid  medication but I did not refill the sertraline  please call patient and see if she is restarting it?  It looks like it has not been filled by: Provider since 2021.  If she is restarting it I need to write it as a taper versus just refilling it and then she needs to schedule a follow-up with her PCP in 4 to 6 weeks if she has been on it then who has been writing it?

## 2023-09-27 NOTE — Telephone Encounter (Unsigned)
 Copied from CRM [id number]. Topic: Clinical - Prescription Issue >> Sep 27, 2023  2:32 PM Alfonso ORN wrote: Reason for CRM: patient checking on the status of getting the   levothyroxine  (SYNTHROID ) 88 MCG tablet , please contact pt on status Patient use to go to [facility] the provider name was Tobias Sharps use to be patient former provider   [id number] [address], [place] - [address] AT [facility] OF [facility] & [address] [place] [address] Phone: [phone] Fax: [phone]

## 2023-09-27 NOTE — Telephone Encounter (Signed)
 Spoke with patient, based on chart review, this medication was sent to pharmacy yesterday. Patient verbalized understanding.

## 2023-10-19 ENCOUNTER — Ambulatory Visit

## 2023-10-25 ENCOUNTER — Ambulatory Visit

## 2023-10-25 DIAGNOSIS — Z1231 Encounter for screening mammogram for malignant neoplasm of breast: Secondary | ICD-10-CM | POA: Diagnosis not present

## 2023-10-27 ENCOUNTER — Ambulatory Visit: Payer: Self-pay | Admitting: Urgent Care

## 2023-11-13 ENCOUNTER — Encounter: Payer: Self-pay | Admitting: Radiology

## 2023-12-26 ENCOUNTER — Other Ambulatory Visit: Payer: Self-pay | Admitting: Family Medicine

## 2023-12-28 ENCOUNTER — Ambulatory Visit: Admitting: Sports Medicine

## 2023-12-28 ENCOUNTER — Ambulatory Visit: Payer: Self-pay

## 2023-12-28 VITALS — BP 135/76 | HR 84 | Temp 98.2°F | Wt 178.4 lb

## 2023-12-28 DIAGNOSIS — E039 Hypothyroidism, unspecified: Secondary | ICD-10-CM

## 2023-12-28 DIAGNOSIS — R21 Rash and other nonspecific skin eruption: Secondary | ICD-10-CM

## 2023-12-28 LAB — COMPREHENSIVE METABOLIC PANEL WITH GFR
ALT: 15 U/L (ref 3–35)
AST: 20 U/L (ref 5–37)
Albumin: 4.8 g/dL (ref 3.5–5.2)
Alkaline Phosphatase: 97 U/L (ref 39–117)
BUN: 16 mg/dL (ref 6–23)
CO2: 29 meq/L (ref 19–32)
Calcium: 9.5 mg/dL (ref 8.4–10.5)
Chloride: 98 meq/L (ref 96–112)
Creatinine, Ser: 0.83 mg/dL (ref 0.40–1.20)
GFR: 68.97 mL/min (ref >60.00)
Glucose, Bld: 87 mg/dL (ref 70–99)
Potassium: 4.1 meq/L (ref 3.5–5.1)
Sodium: 139 meq/L (ref 135–145)
Total Bilirubin: 0.3 mg/dL (ref 0.2–1.2)
Total Protein: 7.2 g/dL (ref 6.0–8.3)

## 2023-12-28 LAB — CBC WITH DIFFERENTIAL/PLATELET
Basophils Absolute: 0.1 K/uL (ref 0.0–0.1)
Basophils Relative: 0.9 % (ref 0.0–3.0)
Eosinophils Absolute: 0.3 K/uL (ref 0.0–0.7)
Eosinophils Relative: 2.8 % (ref 0.0–5.0)
HCT: 43.3 % (ref 36.0–46.0)
Hemoglobin: 14.6 g/dL (ref 12.0–15.0)
Lymphocytes Relative: 18.5 % (ref 12.0–46.0)
Lymphs Abs: 1.8 K/uL (ref 0.7–4.0)
MCHC: 33.7 g/dL (ref 30.0–36.0)
MCV: 90.9 fl (ref 78.0–100.0)
Monocytes Absolute: 0.7 K/uL (ref 0.1–1.0)
Monocytes Relative: 7.9 % (ref 3.0–12.0)
Neutro Abs: 6.6 K/uL (ref 1.4–7.7)
Neutrophils Relative %: 69.9 % (ref 43.0–77.0)
Platelets: 296 K/uL (ref 150.0–400.0)
RBC: 4.77 Mil/uL (ref 3.87–5.11)
RDW: 14.7 % (ref 11.5–15.5)
WBC: 9.5 K/uL (ref 4.0–10.5)

## 2023-12-28 LAB — TSH: TSH: 1.99 u[IU]/mL (ref 0.35–5.50)

## 2023-12-28 MED ORDER — TRIAMCINOLONE ACETONIDE 0.1 % EX CREA: 1.0000 | TOPICAL_CREAM | Freq: Two times a day (BID) | CUTANEOUS | 0 refills | Status: AC

## 2023-12-28 NOTE — Progress Notes (Signed)
 Careteam: Patient Care Team: Lowella Benton CROME, [place] as PCP - General (Physician Assistant)  Allergies[1]  Chief Complaint  Patient presents with   Rash    Pt has a rash on her chest that's been there for a few weeks. The rash is painful.    Discussed the use of AI scribe software for clinical note transcription with the patient, who gave verbal consent to proceed.  History of Present Illness    Debbie Ortega is a 73 year old female who presents with a worsening rash on her neck.  She has experienced a rash on her neck for approximately three weeks. Initially, it presented as clear, itchy blister that was sensitive to touch, particularly when her hair contacted the area. The rash has since worsened, spreading from a smaller area to a larger one. She initially applied Aquaphor cream without relief, which helped with the itching but did not improve the rash. This morning, the rash appeared worse.  In addition to the neck rash, she has developed itching and bleeding in two spots on her back, which began last night. These areas do not resemble the neck rash. No new jewelry, detergents, or changes in diet that could have triggered the rash. She denies fever, chills, recent travel, or exposure to new environments. She takes Zyrtec daily for allergies and acquired a new dog in April.  Her current medications include thyroid  medication and Zoloft , which she takes for both depression and anxiety. She denies any recent changes in medication. No difficulty swallowing, swelling of the lips or tongue, or gastrointestinal symptoms such as nausea, vomiting, diarrhea, or abdominal pain.    Review of Systems:  Review of Systems  Constitutional:  Negative for chills and fever.  HENT:  Negative for congestion and sore throat.   Eyes:  Negative for double vision.  Respiratory:  Negative for cough, sputum production and shortness of breath.   Cardiovascular:  Negative for chest pain, palpitations and  leg swelling.  Gastrointestinal:  Negative for abdominal pain, heartburn and nausea.  Genitourinary:  Negative for dysuria, frequency and hematuria.  Musculoskeletal:  Negative for falls and myalgias.  Skin:  Positive for itching and rash.  Neurological:  Negative for dizziness, sensory change and focal weakness.  Psychiatric/Behavioral:  Negative for depression and suicidal ideas.    Negative unless indicated in HPI.   Patient Active Problem List   Diagnosis Date Noted   Arthralgia of right temporomandibular joint 02/11/2019   Allergic rhinitis 12/28/2016   Hx of basal cell carcinoma 12/24/2015   S/P ORIF (open reduction internal fixation) fracture 06/25/2015   Left knee pain 12/15/2014   Osteoporosis 12/15/2014   Acquired hypothyroidism 12/15/2014   Depression, major, single episode, complete remission 12/15/2014   Hyperlipidemia 12/15/2014   Hx of colonic polyps 12/15/2014   FH: lung cancer 12/15/2014   FH: Parkinson's disease 12/15/2014   FH: leukemia 12/15/2014   Obesity, Class I, BMI 30-34.9 12/15/2014   Vitamin D  deficiency 12/15/2014   Past Medical History:  Diagnosis Date   Allergy 1970   Anxiety    Cancer (HCC) 2024   skin ca   Cataract 2021   Depression 2012   Fracture 06/21/2015   Left   Hyperlipidemia    Joint pain    Osteoporosis    Thyroid  disease    Past Surgical History:  Procedure Laterality Date   COLONOSCOPY  07/2014   3 polyps - repeat 5 yrs   EYE SURGERY  2022   Cataract  FRACTURE SURGERY  2021   ORIF ANKLE FRACTURE Left 06/25/2015   Procedure: OPEN REDUCTION INTERNAL FIXATION (ORIF) ANKLE FRACTURE;  Surgeon: Ozell Flake, MD;  Location: [facility] OR[facility];  Service: Orthopedics;  Laterality: Left;   SKIN CANCER EXCISION     nose   TUBAL LIGATION  1985   Social History[2] Family History  Problem Relation Age of Onset   Breast cancer Maternal Grandmother 22   Allergies[3]  Medications: Patient's Medications  New Prescriptions    TRIAMCINOLONE  CREAM (KENALOG ) 0.1 %    Apply 1 Application topically 2 (two) times daily.  Previous Medications   CALCIUM CARB-CHOLECALCIFEROL  (CALCIUM+D3) 600-20 MG-MCG TABS    Take 1 tablet by mouth daily.   CETIRIZINE (ZYRTEC) 10 MG TABLET    Take 10 mg by mouth daily.   LEVOTHYROXINE  (SYNTHROID ) 88 MCG TABLET    TAKE 1 TABLET(88 MCG) BY MOUTH DAILY BEFORE BREAKFAST   MAGNESIUM  OXIDE -MG SUPPLEMENT 250 MG TABS    Take 1 tablet by mouth daily.   MULTIPLE VITAMINS-MINERALS (CENTRUM SILVER 50+WOMEN PO)    Take 1 tablet by mouth daily.   SERTRALINE  (ZOLOFT ) 50 MG TABLET    Take 1 tablet (50 mg total) by mouth daily.  Modified Medications   No medications on file  Discontinued Medications   No medications on file    Physical Exam: Vitals:   12/28/23 1353 12/28/23 1358  BP: (!) 141/85 135/76  Pulse: 84   Temp: 98.2 F (36.8 C)   TempSrc: Oral   SpO2: 97%   Weight: 178 lb 6.4 oz (80.9 kg)    Body mass index is 32.63 kg/m. BP Readings from Last 3 Encounters:  12/28/23 135/76  07/25/23 131/77  04/25/23 134/83   Wt Readings from Last 3 Encounters:  12/28/23 178 lb 6.4 oz (80.9 kg)  07/25/23 176 lb 8 oz (80.1 kg)  04/25/23 172 lb 12.8 oz (78.4 kg)    Physical Exam Constitutional:      Appearance: Normal appearance.  HENT:     Head: Normocephalic and atraumatic.  Cardiovascular:     Rate and Rhythm: Normal rate and regular rhythm.  Pulmonary:     Effort: Pulmonary effort is normal. No respiratory distress.     Breath sounds: Normal breath sounds. No wheezing.  Abdominal:     General: Bowel sounds are normal. There is no distension.     Tenderness: There is no abdominal tenderness. There is no guarding or rebound.     Comments:    Musculoskeletal:        General: No swelling or tenderness.  Skin:    Comments: Erythematous rash on her left side of her neck No vesicles Flat  No signs of infection  Neurological:     Mental Status: She is alert. Mental status is at  baseline.     Sensory: No sensory deficit.     Motor: No weakness.     Labs reviewed: Basic Metabolic Panel: Recent Labs    04/11/23 1106  NA 139  K 4.4  CL 99  CO2 31  GLUCOSE 90  BUN 16  CREATININE 0.82  CALCIUM 9.6  MG 2.2  TSH 1.61   Liver Function Tests: Recent Labs    04/11/23 1106  AST 19  ALT 18  ALKPHOS 69  BILITOT 0.5  PROT 7.2  ALBUMIN 4.8   No results for input(s): LIPASE, AMYLASE in the last 8760 hours. No results for input(s): AMMONIA in the last 8760 hours. CBC: Recent Labs  04/11/23 1106  WBC 7.6  NEUTROABS 5.2  HGB 14.8  HCT 44.3  MCV 93.3  PLT 264.0   Lipid Panel: No results for input(s): CHOL, HDL, LDLCALC, TRIG, CHOLHDL, LDLDIRECT in the last 8760 hours. TSH: Recent Labs    04/11/23 1106  TSH 1.61   A1C: No results found for: HGBA1C  Assessment & Plan Rash Flat erythematous area on her neck  No vesicles Will order labs Will send kenalog  cream  Follow up in 4 weeks Orders:   CBC with Differential/Platelet   Comp Met (CMET)   triamcinolone  cream (KENALOG ) 0.1 %; Apply 1 Application topically 2 (two) times daily.  Acquired hypothyroidism Will check TSH Orders:   TSH   Return in about 4 weeks (around 01/25/2024).:   Isaly Fasching     [1]  Allergies Allergen Reactions   Statins Other (See Comments)    Severe mylagias, unable to walk  [2]  Social History Tobacco Use   Smoking status: Never   Smokeless tobacco: Never  Substance Use Topics   Alcohol  use: Yes    Alcohol /week: 2.0 standard drinks of alcohol     Types: 2 Cans of beer per week   Drug use: No  [3]  Allergies Allergen Reactions   Statins Other (See Comments)    Severe mylagias, unable to walk

## 2023-12-28 NOTE — Telephone Encounter (Signed)
Pt scheduled sameday

## 2023-12-28 NOTE — Assessment & Plan Note (Addendum)
 Will check TSH Orders:   TSH

## 2023-12-28 NOTE — Telephone Encounter (Signed)
 FYI Only or Action Required?: FYI only for provider: appointment scheduled on 12/28/23.  Patient was last seen in primary care on 07/25/2023 by Lowella Benton CROME, PA.  Called Nurse Triage reporting Rash.  Symptoms began a week ago.  Interventions attempted: Nothing.  Symptoms are: unchanged.  Triage Disposition: See Physician Within 24 Hours  Patient/caregiver understands and will follow disposition?: Yes   Copied from CRM [id number]. Topic: Clinical - Red Word Triage >> Dec 28, 2023 10:43 AM Adelita E wrote: [facility] that prompted transfer to Nurse Triage: Potential shingles around neck and shoulders. Patient stated it is painful and burning. Reason for Disposition  SEVERE itching (i.e., interferes with sleep, normal activities or school)  Answer Assessment - Initial Assessment Questions Scheduled 12/28/23  Advised call back or ED/911 if symptoms worsen. Patient verbalized understanding.  1. APPEARANCE of RASH: What does the rash look like? (e.g., blisters, dry flaky skin, red spots, redness, sores)     Clear blisters week ago,now red crusting over, 'looks like bad rash 3. LOCATION: Where is the rash located?     Left side only; chin, neck, chest 4. COLOR: What color is the rash? (Note: It is difficult to assess rash color in people with darker-colored skin. When this situation occurs, simply ask the caller to describe what they see.)     Denies red streaks 5. ONSET: When did the rash begin?     2 weeks ago; shingle vaccine 6. FEVER: Do you have a fever? If Yes, ask: What is your temperature, how was it measured, and when did it start?     Denies fever chills n/v 7. ITCHING: Does the rash itch? If Yes, ask: How bad is the itch? (Scale 1-10; or mild, moderate, severe)     Moderate itching, pain; 4/10;hurts to put on shirt or if anything touches it 8. CAUSE: What do you think is causing the rash?     shingles 9. MEDICINE FACTORS: Have you started any new  medicines within the last 2 weeks? (e.g., antibiotics)      no 10. OTHER SYMPTOMS: Do you have any other symptoms? (e.g., dizziness, headache, sore throat, joint pain)       Denies diff breathing, HA, faint, chest pain Encouraged hand hygiene and avoid immunocompromised/ babies  Protocols used: Rash or Redness - [facility]

## 2023-12-29 ENCOUNTER — Telehealth: Payer: Self-pay

## 2023-12-29 ENCOUNTER — Ambulatory Visit: Payer: Self-pay | Admitting: Sports Medicine

## 2023-12-29 DIAGNOSIS — F325 Major depressive disorder, single episode, in full remission: Secondary | ICD-10-CM

## 2023-12-29 MED ORDER — SERTRALINE HCL 50 MG PO TABS: 50.0000 mg | ORAL_TABLET | Freq: Every day | ORAL | 0 refills | Status: AC

## 2023-12-29 NOTE — Telephone Encounter (Signed)
 Confirmed with the [facility] pharmacy that the patient has picked up the levothyroxine  and sertraline  rxs (both were #90).

## 2023-12-29 NOTE — Telephone Encounter (Signed)
 Copied from CRM #[id number]. Topic: Clinical - Prescription Issue >> Dec 29, 2023 11:57 AM Ivette P wrote: Reason for CRM: levothyroxine  (SYNTHROID ) 88 MCG tablet - [facility] is telling pt that they cannot fill medicaiton and the medication is delayed. Per chart shows refill was sent 12/27/2023 and pharmacy confirmed at 3:45 pm   Please follow up with pharmacy.    sertraline  (ZOLOFT ) 50 MG tablet - pt has 2 refills avialabel. but pharamcy is telling her they are not able to fill. pt needs a follow up on both of these medicaitons.   Pt would like a call before end of day.

## 2023-12-29 NOTE — Telephone Encounter (Signed)
 Attempted to contact the pharmacy regarding rxs status update on the requested medications. [facility] is currently closed for lunch from 1 - 2 pm. Will attempt later on today.

## 2024-01-16 ENCOUNTER — Ambulatory Visit

## 2024-01-16 ENCOUNTER — Other Ambulatory Visit: Payer: Self-pay

## 2024-01-16 VITALS — BP 140/66 | HR 90 | Ht 62.0 in | Wt 179.0 lb

## 2024-01-16 DIAGNOSIS — Z13820 Encounter for screening for osteoporosis: Secondary | ICD-10-CM | POA: Diagnosis not present

## 2024-01-16 DIAGNOSIS — Z0000 Encounter for general adult medical examination without abnormal findings: Secondary | ICD-10-CM

## 2024-01-16 DIAGNOSIS — Z780 Asymptomatic menopausal state: Secondary | ICD-10-CM

## 2024-01-16 MED ORDER — LEVOTHYROXINE SODIUM 88 MCG PO TABS: 88.0000 ug | ORAL_TABLET | Freq: Every day | ORAL | 0 refills | Status: AC

## 2024-01-16 NOTE — Progress Notes (Signed)
 "  Chief Complaint  Patient presents with   Medicare Wellness     Subjective:   Debbie Ortega is a 74 y.o. female who presents for a Medicare Annual Wellness Visit.  Visit info / Clinical Intake: Medicare Wellness Visit Type:: Subsequent Annual Wellness Visit Persons participating in visit and providing information:: patient Medicare Wellness Visit Mode:: In-person (required for WTM) Interpreter Needed?: No Pre-visit prep was completed: yes AWV questionnaire completed by patient prior to visit?: yes Date:: 01/12/24 Living arrangements:: lives with spouse/significant other Patient's Overall Health Status Rating: good Typical amount of pain: some Does pain affect daily life?: no Are you currently prescribed opioids?: no  Dietary Habits and Nutritional Risks How many meals a day?: 3 Eats fruit and vegetables daily?: yes Most meals are obtained by: preparing own meals In the last 2 weeks, have you had any of the following?: none Diabetic:: no  Functional Status Activities of Daily Living (to include ambulation/medication): Independent Ambulation: Independent Medication Administration: Independent Home Management (perform basic housework or laundry): Independent Manage your own finances?: yes Primary transportation is: driving Concerns about vision?: no *vision screening is required for WTM* Concerns about hearing?: (!) yes Uses hearing aids?: (!) yes Hear whispered voice?: yes  Fall Screening Falls in the past year?: 0 Number of falls in past year: 0 Was there an injury with Fall?: 0 Fall Risk Category Calculator: 0 Patient Fall Risk Level: Low Fall Risk  Fall Risk Patient at Risk for Falls Due to: No Fall Risks Fall risk Follow up: Falls evaluation completed  Home and Transportation Safety: All rugs have non-skid backing?: yes All stairs or steps have railings?: yes Grab bars in the bathtub or shower?: yes Have non-skid surface in bathtub or shower?: yes Good  home lighting?: yes Regular seat belt use?: yes Hospital stays in the last year:: no  Cognitive Assessment Difficulty concentrating, remembering, or making decisions? : no Will 6CIT or Mini Cog be Completed: yes What year is it?: 0 points What month is it?: 0 points Give patient an address phrase to remember (5 components): [address] [place], [place] [address] About what time is it?: 0 points Count backwards from 20 to 1: 0 points Say the months of the year in reverse: 0 points Repeat the address phrase from earlier: 0 points 6 CIT Score: 0 points  Advance Directives (For Healthcare) Does Patient Have a Medical Advance Directive?: Yes Does patient want to make changes to medical advance directive?: No - Guardian declined Type of Advance Directive: Living will; Healthcare Power of Attorney  Reviewed/Updated  Reviewed/Updated: Reviewed All (Medical, Surgical, Family, Medications, Allergies, Care Teams, Patient Goals)    Allergies (verified) Statins   Current Medications (verified) Outpatient Encounter Medications as of 01/16/2024  Medication Sig   Calcium Carb-Cholecalciferol  (CALCIUM+D3) 600-20 MG-MCG TABS Take 1 tablet by mouth daily.   cetirizine (ZYRTEC) 10 MG tablet Take 10 mg by mouth daily.   levothyroxine  (SYNTHROID ) 88 MCG tablet TAKE 1 TABLET(88 MCG) BY MOUTH DAILY BEFORE BREAKFAST   Magnesium  Oxide -Mg Supplement 250 MG TABS Take 1 tablet by mouth daily.   Multiple Vitamins-Minerals (CENTRUM SILVER 50+WOMEN PO) Take 1 tablet by mouth daily.   sertraline  (ZOLOFT ) 50 MG tablet Take 1 tablet (50 mg total) by mouth daily.   triamcinolone  cream (KENALOG ) 0.1 % Apply 1 Application topically 2 (two) times daily.   No facility-administered encounter medications on file as of 01/16/2024.    History: Past Medical History:  Diagnosis Date   Allergy 1970  Anxiety    Cancer (HCC) 2024   skin ca   Cataract 2021   Depression 2012   Fracture 06/21/2015   Left    Hyperlipidemia    Joint pain    Osteoporosis    Thyroid  disease    Past Surgical History:  Procedure Laterality Date   COLONOSCOPY  07/2014   3 polyps - repeat 5 yrs   EYE SURGERY  2022   Cataract   FRACTURE SURGERY  2021   ORIF ANKLE FRACTURE Left 06/25/2015   Procedure: OPEN REDUCTION INTERNAL FIXATION (ORIF) ANKLE FRACTURE;  Surgeon: Ozell Flake, MD;  Location: [facility] ORS;  Service: Orthopedics;  Laterality: Left;   SKIN CANCER EXCISION     nose   TUBAL LIGATION  1985   Family History  Problem Relation Age of Onset   Breast cancer Maternal Grandmother 64   Social History   Occupational History   Not on file  Tobacco Use   Smoking status: Never   Smokeless tobacco: Never  Substance and Sexual Activity   Alcohol  use: Yes    Alcohol /week: 2.0 standard drinks of alcohol     Types: 2 Cans of beer per week   Drug use: No   Sexual activity: Yes    Birth control/protection: Surgical   Tobacco Counseling Counseling given: Not Answered  SDOH Screenings   Food Insecurity: No Food Insecurity (01/16/2024)  Housing: Low Risk (01/16/2024)  Transportation Needs: No Transportation Needs (01/16/2024)  Utilities: Not At Risk (01/16/2024)  Alcohol  Screen: Low Risk (12/28/2023)  Depression (PHQ2-9): Low Risk (01/16/2024)  Financial Resource Strain: Low Risk (12/28/2023)  Physical Activity: Insufficiently Active (01/16/2024)  Social Connections: Socially Integrated (01/16/2024)  Stress: No Stress Concern Present (01/16/2024)  Tobacco Use: Low Risk (12/28/2023)  Health Literacy: Adequate Health Literacy (01/16/2024)   See flowsheets for full screening details  Depression Screen PHQ 2 & 9 Depression Scale- Over the past 2 weeks, how often have you been bothered by any of the following problems? Little interest or pleasure in doing things: 0 Feeling down, depressed, or hopeless (PHQ Adolescent also includes...irritable): 0 PHQ-2 Total Score: 0     Goals Addressed             This Visit's  Progress    Patient Stated       Patient states would like to be more active and walk more.              Objective:    Today's Vitals   01/16/24 0918  BP: (!) 150/59  Pulse: 90  SpO2: 99%  Weight: 179 lb (81.2 kg)  Height: 5' 2 (1.575 m)   Body mass index is 32.74 kg/m.  Hearing/Vision screen No results found. Immunizations and Health Maintenance Health Maintenance  Topic Date Due   Zoster Vaccines- Shingrix  (1 of 2) 08/02/1967   Colonoscopy  07/26/2019   COVID-19 Vaccine (3 - Moderna risk series) 04/23/2024 (Originally 04/18/2019)   Mammogram  10/24/2024   Medicare Annual Wellness (AWV)  01/15/2025   DTaP/Tdap/Td (3 - Td or Tdap) 09/01/2025   Pneumococcal Vaccine: 50+ Years  Completed   Influenza Vaccine  Completed   Bone Density Scan  Completed   Hepatitis C Screening  Completed   Meningococcal B Vaccine  Aged Out        Assessment/Plan:  This is a routine wellness examination for Debbie Ortega.  Patient Care Team: Lowella Benton CROME, [place] as PCP - General (Physician Assistant) Cathlyn Seal, MD as Referring Physician (Dermatology) Genelle Standing, MD  as Consulting Physician (Orthopedic Surgery)  I have personally reviewed and noted the following in the patients chart:   Medical and social history Use of alcohol , tobacco or illicit drugs  Current medications and supplements including opioid prescriptions. Functional ability and status Nutritional status Physical activity Advanced directives List of other physicians Hospitalizations, surgeries, and ER visits in previous 12 months Vitals Screenings to include cognitive, depression, and falls Referrals and appointments  Orders Placed This Encounter  Procedures   DG Bone Density    Standing Status:   Future    Expiration Date:   01/15/2025    Reason for Exam (SYMPTOM  OR DIAGNOSIS REQUIRED):   screening osteoporosis    Preferred imaging location?:   [facility]   In addition, I have reviewed  and discussed with patient certain preventive protocols, quality metrics, and best practice recommendations. A written personalized care plan for preventive services as well as general preventive health recommendations were provided to patient.   Debbie Ortega, CMA   01/16/2024   Return in 1 year (on 01/15/2025).  After Visit Summary: (In Person-Declined) Patient declined AVS at this time.  Nurse Notes:   Debbie Ortega is a 74 y.o. female patient of Benton Gave, [place] who had a Medicare Annual Wellness Visit today. Debbie Ortega lives with her husband. She reports that She is socially active and does interact with friends/family regularly. 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   "

## 2024-01-16 NOTE — Patient Instructions (Signed)
 Debbie Ortega,  Thank you for taking the time for your Medicare Wellness Visit. I appreciate your continued commitment to your health goals. Please review the care plan we discussed, and feel free to reach out if I can assist you further.  Please note that Annual Wellness Visits do not include a physical exam. Some assessments may be limited, especially if the visit was conducted virtually. If needed, we may recommend an in-person follow-up with your provider.  Ongoing Care Seeing your primary care provider every 3 to 6 months helps us  monitor your health and provide consistent, personalized care.   Referrals If a referral was made during today's visit and you haven't received any updates within two weeks, please contact the referred provider directly to check on the status.  Recommended Screenings:  Health Maintenance  Topic Date Due   Zoster (Shingles) Vaccine (1 of 2) 08/02/1967   Colon Cancer Screening  07/26/2019   Medicare Annual Wellness Visit  01/13/2024   COVID-19 Vaccine (3 - Moderna risk series) 04/23/2024*   Breast Cancer Screening  10/24/2024   DTaP/Tdap/Td vaccine (3 - Td or Tdap) 09/01/2025   Pneumococcal Vaccine for age over 43  Completed   Flu Shot  Completed   Osteoporosis screening with Bone Density Scan  Completed   Hepatitis C Screening  Completed   Meningitis B Vaccine  Aged Out  *Topic was postponed. The date shown is not the original due date.       01/16/2024    9:35 AM  Advanced Directives  Does Patient Have a Medical Advance Directive? Yes  Type of Advance Directive Living will;Healthcare Power of Attorney  Does patient want to make changes to medical advance directive? No - Guardian declined    Vision: Annual vision screenings are recommended for early detection of glaucoma, cataracts, and diabetic retinopathy. These exams can also reveal signs of chronic conditions such as diabetes and high blood pressure.  Dental: Annual dental screenings help detect  early signs of oral cancer, gum disease, and other conditions linked to overall health, including heart disease and diabetes.  Please see the attached documents for additional preventive care recommendations.

## 2024-01-19 ENCOUNTER — Encounter (HOSPITAL_COMMUNITY): Payer: Self-pay

## 2024-01-19 ENCOUNTER — Ambulatory Visit (INDEPENDENT_AMBULATORY_CARE_PROVIDER_SITE_OTHER)

## 2024-01-19 ENCOUNTER — Ambulatory Visit (HOSPITAL_COMMUNITY)
Admission: EM | Admit: 2024-01-19 | Discharge: 2024-01-19 | Disposition: A | Discharge status: Home / Self Care | Attending: Emergency Medicine | Admitting: Emergency Medicine

## 2024-01-19 DIAGNOSIS — J069 Acute upper respiratory infection, unspecified: Secondary | ICD-10-CM | POA: Diagnosis not present

## 2024-01-19 DIAGNOSIS — J189 Pneumonia, unspecified organism: Secondary | ICD-10-CM

## 2024-01-19 DIAGNOSIS — U071 COVID-19: Secondary | ICD-10-CM | POA: Diagnosis not present

## 2024-01-19 LAB — POCT INFLUENZA A/B
Influenza A, POC: NEGATIVE
Influenza B, POC: NEGATIVE

## 2024-01-19 LAB — POC SOFIA SARS ANTIGEN FIA: SARS Coronavirus 2 Ag: POSITIVE — AB

## 2024-01-19 LAB — POCT RESPIRATORY SYNCYTIAL VIRUS: RSV Rapid Ag: NEGATIVE

## 2024-01-19 MED ORDER — PAXLOVID (300/100) 20 X 150 MG & 10 X 100MG PO TBPK: 3.0000 | ORAL_TABLET | Freq: Two times a day (BID) | ORAL | 0 refills | Status: AC

## 2024-01-19 MED ORDER — BENZONATATE 100 MG PO CAPS: 100.0000 mg | ORAL_CAPSULE | Freq: Two times a day (BID) | ORAL | 0 refills | Status: AC

## 2024-01-19 MED ORDER — AZITHROMYCIN 250 MG PO TABS: 250.0000 mg | ORAL_TABLET | Freq: Every day | ORAL | 0 refills | Status: AC

## 2024-01-19 NOTE — Discharge Instructions (Addendum)
 Please purchase a pulse oximeter to use at home to check your oxygen levels. If your oxygen percent is dropping below 92% and staying low, even if you take deep breaths, please go to the ER/call 911. If you are short of breath and it is getting worse, please go to the ER/call 911  Continue taking dayquil for your symptoms.   It's ok to not be hungry and not eat much but it is very important to drink lots of liquids and stay hydrated.   It's very important that you take the anti-viral covid medicine as prescribed. It may be very expensive. Do an internet search for pax access to find the drug company's program to enroll in to get copay assistance if the medicine is too expensive for you.   Paxlovid  leaves a bad taste in your mouth, it is not permanent and will go away when you stop the medicine.

## 2024-01-19 NOTE — ED Provider Notes (Signed)
 " [facility]    CSN: [id number] Arrival date & time: 01/19/24  0801      History   Chief Complaint Chief Complaint  Patient presents with   Cough    HPI Debbie Ortega is a 74 y.o. female.   Pt reports illness sx for 3 days. Cough and sore throat are most bothersome sx. Was fine until she did her annual medicare wellness visit 01/16/23 - lots of people coughign in the waiting room when she was there. Became ill the next day.   Taking dayquil, benadryl, and advil for sx. Last took dayquil this morning prior to coming here.   VS at medicare AWV 1/6 were SpO2 99%, HR 99   Cough   Past Medical History:  Diagnosis Date   Allergy 1970   Anxiety    Cancer (HCC) 2024   skin ca   Cataract 2021   Depression 2012   Fracture 06/21/2015   Left   Hyperlipidemia    Joint pain    Osteoporosis    Thyroid  disease     Patient Active Problem List   Diagnosis Date Noted   Arthralgia of right temporomandibular joint 02/11/2019   Allergic rhinitis 12/28/2016   Hx of basal cell carcinoma 12/24/2015   S/P ORIF (open reduction internal fixation) fracture 06/25/2015   Left knee pain 12/15/2014   Osteoporosis 12/15/2014   Acquired hypothyroidism 12/15/2014   Depression, major, single episode, complete remission 12/15/2014   Hyperlipidemia 12/15/2014   Hx of colonic polyps 12/15/2014   FH: lung cancer 12/15/2014   FH: Parkinson's disease 12/15/2014   FH: leukemia 12/15/2014   Obesity, Class I, BMI 30-34.9 12/15/2014   Vitamin D  deficiency 12/15/2014    Past Surgical History:  Procedure Laterality Date   COLONOSCOPY  07/2014   3 polyps - repeat 5 yrs   EYE SURGERY  2022   Cataract   FRACTURE SURGERY  2021   ORIF ANKLE FRACTURE Left 06/25/2015   Procedure: OPEN REDUCTION INTERNAL FIXATION (ORIF) ANKLE FRACTURE;  Surgeon: Ozell Flake, MD;  Location: [facility] ORS;  Service: Orthopedics;  Laterality: Left;   SKIN CANCER EXCISION     nose   TUBAL LIGATION  1985    OB  History   No obstetric history on file.      Home Medications    Prior to Admission medications  Medication Sig Start Date End Date Taking? Authorizing Provider  azithromycin  (ZITHROMAX ) 250 MG tablet Take 1 tablet (250 mg total) by mouth daily. Take first 2 tablets together, then 1 every day until finished. 01/19/24  Yes Richad Jon HERO, NP  benzonatate  (TESSALON ) 100 MG capsule Take 1 capsule (100 mg total) by mouth 2 (two) times daily. 01/19/24  Yes Richad Jon HERO, NP  Calcium Carb-Cholecalciferol  (CALCIUM+D3) 600-20 MG-MCG TABS Take 1 tablet by mouth daily.   Yes [provider]  cetirizine (ZYRTEC) 10 MG tablet Take 10 mg by mouth daily.   Yes [provider]  levothyroxine  (SYNTHROID ) 88 MCG tablet Take 1 tablet (88 mcg total) by mouth daily before breakfast. 01/16/24  Yes Crain, Whitney L, PA  Magnesium  Oxide -Mg Supplement 250 MG TABS Take 1 tablet by mouth daily.   Yes [provider]  Multiple Vitamins-Minerals (CENTRUM SILVER 50+WOMEN PO) Take 1 tablet by mouth daily.   Yes [provider]  nirmatrelvir/ritonavir (PAXLOVID , 300/100,) 20 x 150 MG & 10 x 100MG  TBPK Take 3 tablets by mouth 2 (two) times daily for 5 days. Patient GFR  is 68.97. Take nirmatrelvir (150 mg) two tablets twice daily for 5 days and ritonavir (100 mg) one tablet twice daily for 5 days. 01/19/24 01/24/24 Yes Richad Jon HERO, NP  sertraline  (ZOLOFT ) 50 MG tablet Take 1 tablet (50 mg total) by mouth daily. 12/29/23  Yes Crain, Whitney L, PA  triamcinolone  cream (KENALOG ) 0.1 % Apply 1 Application topically 2 (two) times daily. 12/28/23  Yes Sherlynn Madden, MD    Family History Family History  Problem Relation Age of Onset   Breast cancer Maternal Grandmother 20    Social History Social History[1]   Allergies   Statins   Review of Systems Review of Systems  Respiratory:  Positive for cough.      Physical Exam Triage Vital Signs ED Triage Vitals  Encounter  Vitals Group     BP 01/19/24 0824 127/80     Girls Systolic BP Percentile --      Girls Diastolic BP Percentile --      Boys Systolic BP Percentile --      Boys Diastolic BP Percentile --      Pulse Rate 01/19/24 0824 (!) 102     Resp 01/19/24 0824 18     Temp 01/19/24 0824 98.9 F (37.2 C)     Temp Source 01/19/24 0824 Oral     SpO2 01/19/24 0824 94 %     Weight 01/19/24 0823 175 lb (79.4 kg)     Height 01/19/24 0823 5' 2 (1.575 m)     Head Circumference --      Peak Flow --      Pain Score 01/19/24 0822 5     Pain Loc --      Pain Education --      Exclude from Growth Chart --    No data found.  Updated Vital Signs BP 127/80 (BP Location: Left Arm)   Pulse (!) 102   Temp 98.9 F (37.2 C) (Oral)   Resp 18   Ht 5' 2 (1.575 m)   Wt 175 lb (79.4 kg)   SpO2 94%   BMI 32.01 kg/m   Visual Acuity Right Eye Distance:   Left Eye Distance:   Bilateral Distance:    Right Eye Near:   Left Eye Near:    Bilateral Near:     Physical Exam   UC Treatments / Results  Labs (all labs ordered are listed, but only abnormal results are displayed) Labs Reviewed  POC SOFIA SARS ANTIGEN FIA - Abnormal; Notable for the following components:      Result Value   SARS Coronavirus 2 Ag Positive (*)    All other components within normal limits  POCT RESPIRATORY SYNCYTIAL VIRUS  POCT INFLUENZA A/B    EKG   Radiology DG Chest 2 View Result Date: 01/19/2024 EXAM: 2 VIEW(S) XRAY OF THE CHEST 01/19/2024 08:51:39 AM COMPARISON: None available. CLINICAL HISTORY: 74 year old female. Fever, cough, decreased oxygen saturation (SpO2) from 99% on 01/16/2023 to 94% today. FINDINGS: LUNGS AND PLEURA: Biapical pleural-parenchymal scarring. Symmetric diffuse mildly increased pulmonary interstitium. No confluent pulmonary opacity. No pleural effusion. No pneumothorax. HEART AND MEDIASTINUM: No acute abnormality of the cardiac and mediastinal silhouettes. BONES AND SOFT TISSUES: No acute osseous  abnormality. IMPRESSION: 1. Symmetric diffuse mildly increased pulmonary interstitium, suspicious for acute viral/atypical respiratory infection in this setting. Electronically signed by: Helayne Hurst MD MD 01/19/2024 08:58 AM EST RP Workstation: [id number]    Procedures Procedures (including critical care time)  Medications Ordered in UC Medications -  No data to display  Initial Impression / Assessment and Plan / UC Course  I have reviewed the triage vital signs and the nursing notes.  Pertinent labs & imaging results that were available during my care of the patient were reviewed by me and considered in my medical decision making (see chart for details).    I think she is likely febrile but has taken tylenol  (in dayquil) this morning so fever is reduced.   I think she has a pneumonia in addition to covid. Will treat for atypical pna as well as covid. SpO2 is low at 94% compared to normal  Discussed in detail reasons for seeking ER care. She is borderline at this moment for severe illness. If she gets much worse, she may need higher level/inpatient care. I discussed this with pt and gave strict explicit reasons for calling 911/going to ER.   Final Clinical Impressions(s) / UC Diagnoses   Final diagnoses:  Upper respiratory tract infection, unspecified type  COVID  Atypical pneumonia     Discharge Instructions      Please purchase a pulse oximeter to use at home to check your oxygen levels. If it is dropping below 92% and staying low, even if you take deep breaths, please go to the ER/call 911. If you are short of breath and it is getting worse, please go to the ER/call 911  Continue taking dayquil for your symptoms.   It's ok to not be hungry and not eat much but it is very important to drink lots of liquids and stay hydrated.   It's very important that you take the anti-viral covid medicine as prescribed. It may be very expensive. Do an internet search for pax access to find  the drug company's program to enroll in to get copay assistance if the medicine is too expensive for you.   Paxlovid  leaves a bad taste in your mouth, it is not permanent and will go away when you stop the medicine.        ED Prescriptions     Medication Sig Dispense Auth. Provider   nirmatrelvir/ritonavir (PAXLOVID , 300/100,) 20 x 150 MG & 10 x 100MG  TBPK Take 3 tablets by mouth 2 (two) times daily for 5 days. Patient GFR is 68.97. Take nirmatrelvir (150 mg) two tablets twice daily for 5 days and ritonavir (100 mg) one tablet twice daily for 5 days. 30 tablet Richad Jon HERO, NP   azithromycin  (ZITHROMAX ) 250 MG tablet Take 1 tablet (250 mg total) by mouth daily. Take first 2 tablets together, then 1 every day until finished. 6 tablet Richad Jon HERO, NP   benzonatate  (TESSALON ) 100 MG capsule Take 1 capsule (100 mg total) by mouth 2 (two) times daily. 20 capsule Richad Jon HERO, NP      PDMP not reviewed this encounter.    [1]  Social History Tobacco Use   Smoking status: Never   Smokeless tobacco: Never  Substance Use Topics   Alcohol  use: Yes    Alcohol /week: 2.0 standard drinks of alcohol     Types: 2 Cans of beer per week   Drug use: No     Richad Jon HERO, NP 01/19/24 [phone]  "

## 2024-01-19 NOTE — ED Triage Notes (Signed)
 Patient presenting with sore throat and severe cough. Patient states the cough keeps her up[ at night. Onset 3 days ago. No known sick exposure.   Patient tried Dayquil, benadryl, Advil, airborne vitamin c with no relief.

## 2024-01-22 ENCOUNTER — Ambulatory Visit: Admitting: Urgent Care

## 2024-01-25 ENCOUNTER — Ambulatory Visit: Admitting: Sports Medicine

## 2024-07-25 ENCOUNTER — Encounter: Admitting: Urgent Care
